# Patient Record
Sex: Male | Born: 1935 | Race: White | Hispanic: No | Smoking: Former smoker
Health system: Southern US, Community
[De-identification: ages and names within clinical notes are randomized; demographics above are authoritative.]

## PROBLEM LIST (undated history)

## (undated) DIAGNOSIS — I2510 Atherosclerotic heart disease of native coronary artery without angina pectoris: Secondary | ICD-10-CM

## (undated) DIAGNOSIS — E785 Hyperlipidemia, unspecified: Secondary | ICD-10-CM

## (undated) DIAGNOSIS — I639 Cerebral infarction, unspecified: Secondary | ICD-10-CM

## (undated) DIAGNOSIS — I219 Acute myocardial infarction, unspecified: Secondary | ICD-10-CM

## (undated) DIAGNOSIS — I6529 Occlusion and stenosis of unspecified carotid artery: Secondary | ICD-10-CM

## (undated) DIAGNOSIS — E7800 Pure hypercholesterolemia, unspecified: Secondary | ICD-10-CM

## (undated) DIAGNOSIS — I10 Essential (primary) hypertension: Secondary | ICD-10-CM

## (undated) HISTORY — DX: Essential (primary) hypertension: I10

## (undated) HISTORY — DX: Cerebral infarction, unspecified: I63.9

## (undated) HISTORY — DX: Atherosclerotic heart disease of native coronary artery without angina pectoris: I25.10

## (undated) HISTORY — DX: Hyperlipidemia, unspecified: E78.5

## (undated) HISTORY — PX: CARDIAC SURGERY: SHX584

## (undated) HISTORY — DX: Acute myocardial infarction, unspecified: I21.9

## (undated) HISTORY — DX: Occlusion and stenosis of unspecified carotid artery: I65.29

---

## 1991-06-30 DIAGNOSIS — I219 Acute myocardial infarction, unspecified: Secondary | ICD-10-CM

## 1991-06-30 HISTORY — DX: Acute myocardial infarction, unspecified: I21.9

## 2007-08-03 ENCOUNTER — Inpatient Hospital Stay (HOSPITAL_COMMUNITY): Admission: RE | Admit: 2007-08-03 | Discharge: 2007-08-07 | Payer: Self-pay | Admitting: Orthopedic Surgery

## 2007-08-10 ENCOUNTER — Ambulatory Visit: Payer: Self-pay | Admitting: *Deleted

## 2007-08-28 ENCOUNTER — Ambulatory Visit: Payer: Self-pay | Admitting: Surgery

## 2007-08-28 ENCOUNTER — Inpatient Hospital Stay (HOSPITAL_COMMUNITY): Admission: EM | Admit: 2007-08-28 | Discharge: 2007-08-31 | Payer: Self-pay | Admitting: Emergency Medicine

## 2007-08-29 ENCOUNTER — Encounter: Payer: Self-pay | Admitting: Endocrinology

## 2007-08-29 ENCOUNTER — Encounter: Payer: Self-pay | Admitting: Gastroenterology

## 2007-08-30 ENCOUNTER — Ambulatory Visit: Payer: Self-pay | Admitting: Gastroenterology

## 2007-09-01 ENCOUNTER — Encounter: Payer: Self-pay | Admitting: Pulmonary Disease

## 2008-02-21 ENCOUNTER — Inpatient Hospital Stay (HOSPITAL_COMMUNITY): Admission: RE | Admit: 2008-02-21 | Discharge: 2008-02-25 | Payer: Self-pay | Admitting: Orthopedic Surgery

## 2008-03-26 ENCOUNTER — Encounter: Admission: RE | Admit: 2008-03-26 | Discharge: 2008-05-06 | Payer: Self-pay | Admitting: Orthopedic Surgery

## 2009-05-08 ENCOUNTER — Encounter: Admission: RE | Admit: 2009-05-08 | Discharge: 2009-05-08 | Payer: Self-pay | Admitting: Internal Medicine

## 2009-06-20 ENCOUNTER — Encounter: Admission: RE | Admit: 2009-06-20 | Discharge: 2009-06-20 | Payer: Self-pay | Admitting: Neurology

## 2009-06-25 ENCOUNTER — Encounter: Admission: RE | Admit: 2009-06-25 | Discharge: 2009-08-14 | Payer: Self-pay | Admitting: Neurology

## 2009-06-26 ENCOUNTER — Encounter: Admission: RE | Admit: 2009-06-26 | Discharge: 2009-06-26 | Payer: Self-pay | Admitting: Neurology

## 2009-09-15 ENCOUNTER — Encounter: Admission: RE | Admit: 2009-09-15 | Discharge: 2009-09-15 | Payer: Self-pay | Admitting: Internal Medicine

## 2010-03-18 ENCOUNTER — Encounter: Admission: RE | Admit: 2010-03-18 | Discharge: 2010-03-18 | Payer: Self-pay | Admitting: Internal Medicine

## 2010-05-25 ENCOUNTER — Ambulatory Visit: Payer: Self-pay | Admitting: Surgery

## 2010-11-09 ENCOUNTER — Ambulatory Visit: Payer: Self-pay | Admitting: Surgery

## 2010-12-21 ENCOUNTER — Encounter: Payer: Self-pay | Admitting: Internal Medicine

## 2011-04-13 NOTE — H&P (Signed)
NAMELITTLETON, HAUB NO.:  [id number]   MEDICAL RECORD NO.:  [id number]          PATIENT TYPE:  INP   LOCATION:  NA                           FACILITY:  [facility]   PHYSICIAN:  Ollen Gross, M.D.    DATE OF BIRTH:  Feb 09, 1927   DATE OF ADMISSION:  08/03/2007  DATE OF DISCHARGE:                              HISTORY & PHYSICAL   DATE OF OFFICE VISIT HISTORY AND PHYSICAL:  July 06, 2007   CHIEF COMPLAINT:  Right knee pain.   HISTORY OF PRESENT ILLNESS:  The patient is 77 year old male with  ongoing problems with his right knee; it has progressively gotten worse  over time.  He has been treated by Dr. Luiz Blare and Dr. Jerl Santos in the  past.  He has undergone serial aspirations and injections.  He was told  at some point in the past that he would need a knee replacement.  He was  seen by Dr. Lequita Halt in a second opinion and found to have bone-on-bone  in the lateral compartment with bone-on-bone patellofemoral compartment,  worse on the right than the left.  The right knee has been more  symptomatic at this time.  It is felt that he would benefit from  undergoing surgical intervention.  The risks and benefits have been  discussed and he elects to proceed with surgery.  He has been seen by  Dr. Aleen Campi preoperatively and felt okay from a cardiac standpoint.  He  has had a recent stress test done in July 2008 and felt to be a low-risk  scan   ALLERGIES:  NO KNOWN DRUG ALLERGIES.   CURRENT MEDICATIONS:  Crestor, Diovan, aspirin, multivitamin.   PAST MEDICAL HISTORY:  1. Hypertension.  2. Hypercholesterolemia.  3. Coronary arterial disease.  4. History of myocardial infarction.  5. Carotid arterial disease.  6. Atrial flutter.  7. History of renal calculi.   PAST SURGICAL HISTORY:  1. Cardiac catheterization with angioplasty.  2. Gallbladder surgery.   SOCIAL HISTORY:  Single, retired, nonsmoker, no alcohol, 1 child, lives  alone, no help after surgery.   FAMILY HISTORY:  Father with history of liver cancer.   REVIEW OF SYSTEMS:  GENERAL:  No fevers, chills or night sweats.  No  seizures, syncope or paralysis.  RESPIRATORY:  No shortness of breath,  productive cough or hemoptysis.  CARDIOVASCULAR:  Extensive cardiac  history with history of MI and heart disease.  No chest pain, angina or  orthopnea.  GI:  No nausea, vomiting, diarrhea, or constipation.  GU:  No dysuria, hematuria or discharge.  MUSCULOSKELETAL:  Right knee.   PHYSICAL EXAMINATION:  VITAL SIGNS:  Pulse 74, respirations 14, blood  pressure 128/78.  GENERAL:  This is a 77 year old white male of short stature, in no acute  distress.  He is alert, oriented and cooperative.  HEENT:  Normocephalic, atraumatic.  Pupils are round and reactive.  Oropharynx  clear.  EOMs intact.  He does have full upper and lower dentures in  place.  NECK:  Trace right bruit, negative on the left.  Neck is supple.  CHEST:  Clear, anterior and posterior chest walls.  No rhonchi, rales or  wheezing.  HEART:  Regular rate and rhythm with a grade 2/6 to 3/6 mid systolic  aortic murmur.  ABDOMEN:  Soft, nontender.  Bowel sounds present.  RECTAL, BREASTS AND GENITALIA;  Not done and not pertinent to present  illness.  EXTREMITIES:  Right knee shows range of motion of 5-120, tender more  medial than lateral.  Marked crepitus is noted.   IMPRESSION:  1. Osteoarthritis, right knee.  2. Hypertension.  3. Coronary arterial disease.  4. History of myocardial infarction.  5. Status post cardiac catheterization with angioplasty.  6. Carotid arterial disease.  7. History of atrial flutter.  8. Hypercholesterolemia.  9. History of renal calculi.   PLAN:  The patient will be admitted to [facility] to undergo a  right total knee replacement arthroplasty.  Surgery will be performed by  Dr. Ollen Gross.  His cardiologist is Dr. Aleen Campi, who has seen and  evaluated the patient preoperatively and  felt to be stable from a  cardiovascular standpoint.  Dr. Aleen Campi will be notified of the room  number on admission and will be consulted if needed for medical  assistance with the patient throughout the hospital course.      [name], P.A.C.      Ollen Gross, M.D.  Electronically Signed    ALP/MEDQ  D:  08/02/2007  T:  08/03/2007  Job:  [id number]   cc:   Antionette Char, MD  Fax: [phone]

## 2011-04-13 NOTE — Assessment & Plan Note (Signed)
OFFICE VISIT   Glenn Meza, Glenn Meza  DOB:  Mar 31, 1927                                       05/25/2010  ZOXWR#:[id number]   REASON FOR VISIT:  Carotid stenosis.   HISTORY:  This is an 77 year old gentleman I am seeing at the request of  Dr. Selena Batten for evaluation of carotid stenosis.  The patient has been  formally followed by Dr. Aleen Campi for progressive carotid stenosis.  He  has a history of a cerebellar stroke.  He has seen Dr. Pearlean Brownie in the past  for this.  He is being treated medically.  He comes in today with an  ultrasound which shows moderate stenosis in the 50%-70% range with  bilateral antegrade vertebral artery blood flow.  The patient states  that after his second knee replacement approximately a year and a half  ago, he has been having some trouble with his balance.  This was  confirmed with MRI that he had had a stroke.  He does not endorse  symptoms of cerebral ischemia at this time.  He denies numbness or  weakness in either arm or his legs.  He denies numbness or slurred  speech.  He denies amaurosis fugax.   The patient had suffered from a heart attack dating back to 36.  He  underwent coronary angioplasty at that time.  He is also medically  managed for his hypertension and hypercholesterolemia.   REVIEW OF SYSTEMS:  GENERAL:  Negative for fevers, chills weight gain  weight loss.  CARDIAC:  Negative for chest pain and shortness of breath.  PULMONARY:  Negative for bronchitis, asthma or wheezing.  GI:  Negative.  GU:  Negative.  VASCULAR:  Positive for a history of stroke.  NEUROLOGIC:  Positive for unsteadiness.  MUSCULOSKELETAL:  Negative.  PSYCH:  Negative.  EENT:  Negative.  HEME:  Negative.  SKIN:  Negative.   PAST MEDICAL HISTORY:  Hypertension, hypercholesterolemia, history MI in  11.   PAST SURGICAL HISTORY:  Bilateral knee replacement, cholecystectomy and  a coronary angioplasty.   FAMILY HISTORY:  Negative for  cardiovascular at an early age.   SOCIAL HISTORY:  He is single with 1 child.  He is retired.  Does not  smoke, quit a few years ago.  Does not drink alcohol.   MEDICATIONS:  Plavix 75 mg per day, Crestor 10 mg per day, Diovan 160 mg  daily, aspirin 81 mg daily, multivitamin, Pepcid.   ALLERGIES:  None.   PHYSICAL EXAMINATION:  Heart rate 69, blood pressure 131/72 in the right  arm, 119/67 in the left.  O2 saturations are 98% on room air.  Generally, he is well-appearing, in no distress.  HEENT:  Within normal  limits.  Lungs are clear bilaterally.  No wheezes or rhonchi.  Cardiovascular:  Regular rate and rhythm without murmur.  No carotid  bruits.  Abdomen is soft, nontender.  No pulsatile mass, no  hepatosplenomegaly.  Musculoskeletal is without major deformity.  Neuro:  He has no focal deficits.  Skin:  Without rash.   ASSESSMENT:  Asymptomatic right carotid stenosis.   PLAN:  I do not think that the patient's balance issues are related to  his extracranial carotid disease.  I would recommend continuing to treat  him medically with a statin and platelet inhibitor.  I would like to see  him back  in 6 months for a repeat ultrasound.  We discussed deferring  operation in the asymptomatic state until he progresses to greater than  80% stenosis.     Jorge Ny, MD  Electronically Signed   VWB/MEDQ  D:  05/25/2010  T:  05/26/2010  Job:  [id number]   cc:   Massie Maroon, MD

## 2011-04-13 NOTE — H&P (Signed)
Glenn Meza, Glenn Meza                ACCOUNT NO.:  [id number]   MEDICAL RECORD NO.:  [id number]            PATIENT TYPE:   LOCATION:                                 FACILITY:   PHYSICIAN:  Hind Bosie Helper, MD      DATE OF BIRTH:  November 18, 1927   DATE OF ADMISSION:  08/28/2007  DATE OF DISCHARGE:                              HISTORY & PHYSICAL   PRIMARY CARE PHYSICIAN:  Antionette Char, M.D.   CHIEF COMPLAINT:  Epigastric pain and lower chest pain.   HISTORY OF PRESENT ILLNESS:  A 77 year old male with a history of  coronary artery disease status post angioplasty.  History of  osteoarthritis status post right total knee replacement which was done  on August 03, 2007, to which patient was sent to rehab.  The patient  has been at rehab since September 8, until 3 days ago when he was  discharged home.  The patient came today with a main complaint of sudden  onset of epigastric/lower chest pain, which started suddenly.  Severity  was 10/10.  Pain is localized, not radiating.  The pain was grabby in  nature associated with nausea and 2 episodes of vomiting which was  mainly food particles.  The patient denies any change in bowel habits.  The last bowel movement was yesterday.  No relieving factor.  No  aggravating factor.  Pain is completely resolved after coming to the ED  and received pain medication.  The patient denies any yellow  discoloration of the sclerae.  Denies any change in urine and bowel  color.  The patient denies any fever at this time.  The patient denies  any headache.  Denies any numbness or weakness of extremities.   PAST MEDICAL HISTORY:  1. History of osteoarthritis status post right knee replacement on      August 03, 2007.  2. Hypertension.  3. Coronary artery disease status post angioplasty.  4. History of atrial fibrillation.  5. History of hypercholesterolemia.  6. Renal calculi.   ALLERGIES:  NO KNOWN DRUG ALLERGIES.   PAST SURGICAL HISTORY:  1. Status  post right knee replacement.  2. Cholecystectomy in 1988.  3. Cardiac cath with angioplasty.   SOCIAL HISTORY:  Single, retired, nonsmoker, nonalcoholic.  He has one  son.  Lives alone, no help.   FAMILY HISTORY:  Father died of liver cancer and mother died with heart  failure.   SYSTEMIC REVIEW:  Denies fever.  Denies night sweating.  Positive for  chills, mainly when he came to the ED.  Denies any syncope or paralysis.  No shortness of breath.  No cough, no hemoptysis.  No chest pain.  Positive for nausea and vomiting.  Denies any diarrhea.  Positive for  abdominal bloating.   PHYSICAL EXAMINATION:  VITAL SIGNS:  Temperature 97.6, blood pressure  91/46, pulse rate 97, respiratory rate 20, saturation 96% on room air.  HEENT:  The patient is normocephalic, atraumatic.  Pupils equal,  reactive to light and accommodation.  NECK:  No JVD.  LUNGS:  Normal vesicular breathing, equal air  entry.  HEART:  S1, S2 with some tachycardia and a grade 3/6 systolic murmur.  ABDOMEN:  Soft, nontender.  Bowel sounds positive.  EXTREMITIES:  No lower limb edema.   LABORATORY DATA:  CMP sodium 136, potassium 4.3, BUN 27, creatinine 1.8,  total bilirubin 1.5, alkaline phosphatase 297, AST 557, ALT 302.  White  blood cells 11.5 and hemoglobin 11.2, hematocrit 33.2.   Ultrasound showed there is evidence of intra and extrahepatic biliary  ductal dilatation.   ASSESSMENT AND PLAN:  Epigastric abdominal pain, most probably secondary  to retained common bile duct stone.  We will admit the patient, keep him  n.p.o. and start him on IV fluids.  We will get MRCP to evaluate the  intra and extrahepatic biliary ductal obstruction.  The patient has no  fever at this time and no abdominal pain.  We will hold off on any  antibiotic at this time.  There is no evidence of acute cholangitis.  Elevated LFTs mainly transaminitis with mild elevated alkaline  phosphatase, with an obstructive biliary disease, suspect  alkaline  phosphatase would be higher.  We will get a hepatitis profile at this  time.  We will consider a gastroenterology consult.  Deep venous  thrombosis and gastrointestinal prophylaxis.      Hind Bosie Helper, MD  Electronically Signed     HIE/MEDQ  D:  08/28/2007  T:  08/28/2007  Job:  [id number]

## 2011-04-13 NOTE — Discharge Summary (Signed)
Glenn Meza, Glenn Meza NO.:  [id number]   MEDICAL RECORD NO.:  [id number]          PATIENT TYPE:  INP   LOCATION:  5501                         FACILITY:  [facility]   PHYSICIAN:  Isidor Holts, M.D.  DATE OF BIRTH:  [birth year]   DATE OF ADMISSION:  08/27/2007  DATE OF DISCHARGE:  08/31/2007                               DISCHARGE SUMMARY   PRIMARY MEDICAL DOCTOR:  Dr. Charolette Child, M.D.   DISCHARGE DIAGNOSES:  1. Choledocholithiasis, status post ERCP, stone extraction, and      sphincterectomy 08/29/2007 by Dr. Claudette Head, gastroenterologist.  2. History of coronary artery disease, status post percutaneous      intervention.  3. Atrial fibrillation.  4. Hypertension.  5. Dyslipidemia.  6. Urolithiasis.  7. Iron-deficiency anemia requiring transfusion of 2 units packed red      blood cells on 08/29/2007.  8. Renal insufficiency.   PROCEDURES:  1. Chest x-ray dated 08/27/2007.  This showed stable changes of COPD      and chronic bronchitis.  No acute abnormality.  2. Abdominal CT scan/pelvic CT scan dated 08/27/2007.  This showed      intra- and extrahepatic biliary ductal dilation with possible      retained stone at the distal common bile duct.  Further evaluation      with ultrasound by MRCP is recommended.  There was extensive      sigmoid diverticulosis, but no acute findings in the pelvis.  3. Abdominal ultrasound scan dated 08/28/2007.  This showed intra- and      extrahepatic biliary ductal dilation.  Distal common bile duct      could not be visualized due to bowel gas.  However, there is      concern for distal common bile ductal stones, small, nonobstructive      renal calculi. Status post cholecystectomy.  4. ERCP 08/29/2007 by Dr. Claudette Head.  This was an uncomplicated      procedure.  Stone extraction and sphincterectomy were done.   CONSULTATIONS:  Dr. Claudette Head, gastroenterologist.   ADMISSION HISTORY:  As in H&P note of 08/28/2007  dictated by Dr. Ebony Cargo.  However, in brief, this is a 77 year old male, with known  history of osteoarthritis, status post right knee replacement 08/03/2007,  hypertension, CAD, status post angioplasty, atrial fibrillation,  dyslipidemia, renal calculi who presents with a sudden onset of  epigastric/lower chest pain on the day of presentation associated with  nausea and 2 episodes of vomiting.  On initial evaluation, patient was  found to have a total bilirubin of 1.5, alkaline phosphatase 297, AST  557, ALT 302.  He was admitted for further evaluation, investigation,  and management.   CLINICAL COURSE:  1. Choledocholithiasis.  For details of presentation, refer to      admission history above.  As noted above, patient's LFTs were      markedly abnormal with alkaline phosphatase of 297, AST 557, ALT      302, i.e. obstructive picture.  He underwent abdominal/pelvic CT      scan.  For details of findings, refer  to procedure list above.      This was followed up with an abdominal ultrasound scan of which      confirmed intra- and extrahepatic biliary ductal dilation with      common bile duct stones.  Gastroenterology consultation was called,      which was kindly provided by Dr. Claudette Head who carried out ERCP      on 08/29/2007 with stone extraction and sphincterectomy in an      uncomplicated procedure.  Following this, patient's clinical      symptoms considerably ameliorated.  By 08/30/2007, he was totally      asymptomatic, and LFTs have improved with the following findings on      08/31/2007.  Alkaline phosphatase 157, AST 52, ALT 163.   1. Iron-deficiency anemia.  Patient was noted to have a significant      anemia of 8.1 on 08/29/2007.  This was addressed with transfusion of      2 units of PRBCs resulting in a posttransfusion hemoglobin of 9.8.      Iron studies were done, which showed serum iron of less than 10,      i.e. features consistent with profound iron-deficiency  anemia.  Per      GI, patient merits GI workup.  He has therefore been scheduled an      appointment with Dr. Judie Petit on an outpatient basis, to arrange      colonoscopy.   1. Coronary artery disease.  This remained asymptomatic throughout the      course of patient's hospitalization.   1. Renal insufficiency.  Patient at this time of presentation had a      BUN of 27 with a creatinine of 1.8.  His renal indices have      remained within these limits, during the course of his      hospitalization.  As a matter of fact, on 08/31/2007, his BUN was 20      with a creatinine of 1.72 likely secondary to intravenous      hydration.  It is likely that these creatinine levels are patient's      baseline.  We shall defer monitoring of this per his primary MD.   1. Hypertension.  This was controlled throughout the course of      patient's hospitalization off antihypertensive medications.  His      ARB, i.e. Diovan, has been held in view of renal dysfunction.      However, given his history of coronary artery disease, he has been      commenced on low-dose Lopressor 12.5 mg p.o. b.i.d. in the first      instance until reevaluated by his primary cardiologist, Dr.      Aleen Campi.   DISPOSITION:  The patient was considered sufficiently stable to be  discharged on 08/31/2007.   DISCHARGE MEDICATIONS:  1. Crestor 20 mg p.o. q.h.s.  2. Aspirin 325 mg p.o. daily.  3. Lopressor 12.5 mg p.o. b.i.d.   Note Diovan 160 mg has been discontinued secondary to renal  insufficiency, until reevaluated by patient's primary cardiologist, Dr.  Aleen Campi.   DIET:  Heart healthy.   ACTIVITY:  As tolerated.  Patient has home PT/OT.   FOLLOWUP INSTRUCTIONS:  Patient is instructed to follow up with his  primary MD, Dr. Aleen Campi, per prior scheduled appointment.  He is also  to follow up with gastroenterologist, Dr. Claudette Head, on October 02, 2007 at 9:15 a.m., telephone number [phone].  All this has  been  communicated to patient, and he has verbalized understanding.      Isidor Holts, M.D.  Electronically Signed     CO/MEDQ  D:  08/31/2007  T:  08/31/2007  Job:  [id number]   cc:   Antionette Char, MD  Venita Lick. Russella Dar, MD, Clementeen Graham

## 2011-04-13 NOTE — Assessment & Plan Note (Signed)
OFFICE VISIT   TALLIN, HART  DOB:  [birth year]                                       11/09/2010  ONGEX#:[id number]   The patient comes back in today for followup of his extracranial carotid  disease.  He is an 77 year old gentleman that I met in June of this past  year for evaluation of carotid stenosis which had previously been  followed by Dr. Aleen Campi.  He has a history of a cerebellar stroke that  is being treated medically.  At that time he had an ultrasound which  showed 50%-70% stenosis with bilateral antegrade vertebral artery blood  flow.  He has been having some trouble with balance, however, he reports  this has not been changed and thought to be related to his cerebellar  stroke.  He did undergo physical therapy for this but it has not helped.  He denies numbness or weakness in either arm.  He denies slurred speech.  He denies amaurosis fugax.   PHYSICAL EXAMINATION:  Vital signs:  Heart rate 71, blood pressure  144/77 on the right, 120/71 on the left, O2 sats are 96% on room air.  General:  Well-appearing, in no distress.  HEENT:  Within normal limits.  Lungs:  Clear bilaterally.  Cardiovascular:  Regular rate and rhythm.  No carotid bruits.  Neurologic:  He is intact.  Skin:  Without rash.   DIAGNOSTIC STUDIES:  Carotid ultrasound was performed today and this  shows 40%-59% stenosis bilaterally.   ASSESSMENT:  Asymptomatic carotid stenosis.   PLAN:  Based on the patient's current level of stenosis I would  recommend continued medical therapy with blood pressure control,  platelet inhibition and cholesterol modification.  I will plan on seeing  the patient back in 1 year with a repeat ultrasound.     Jorge Ny, MD  Electronically Signed   VWB/MEDQ  D:  11/09/2010  T:  11/09/2010  Job:  [id number]   cc:   Dr Pearlean Brownie  Massie Maroon, MD

## 2011-04-13 NOTE — Op Note (Signed)
NAMEHARUO, Meza NO.:  [id number]   MEDICAL RECORD NO.:  [id number]          PATIENT TYPE:  INP   LOCATION:  1607                         FACILITY:  [facility]   PHYSICIAN:  Ollen Gross, M.D.    DATE OF BIRTH:  04-Mar-1927   DATE OF PROCEDURE:  02/21/2008  DATE OF DISCHARGE:                               OPERATIVE REPORT   PREOPERATIVE DIAGNOSIS:  Osteoarthritis left knee.   POSTOPERATIVE DIAGNOSIS:  Osteoarthritis left knee.   PROCEDURE:  Left total knee arthroplasty.   SURGEON:  Ollen Gross, M.D.   ASSISTANT:  Avel Peace PA-C.   ANESTHESIA:  General with postop Marcaine pain pump.   ESTIMATED BLOOD LOSS:  Minimal.   DRAINS:  None.   TOURNIQUET TIME:  30 minutes at 300 mmHg.   COMPLICATIONS:  None.   CONDITION:  Stable to recovery.   CLINICAL NOTE:  Glenn Meza is a 77 year old male with end-stage  arthritis of the left knee with progressively worsening pain and  dysfunction.  He has had a recent successful right total knee  arthroplasty and presents now for left total knee arthroplasty.   PROCEDURE IN DETAIL:  After the successful administration of general  anesthetic, a tourniquet was placed on the left thigh, left lower  extremity prepped and draped in usual sterile fashion.  Extremity was  wrapped in Esmarch, knee flexed, tourniquet inflated to 300 mmHg.  Midline incision made with a 10 blade through subcutaneous tissue to the  level of the extensor mechanism.  Fresh blade is used to make and a  medial parapatellar arthrotomy.  Soft tissue of the proximal medial  tibia is subperiosteally elevated to the joint line with the knife,  semimembranosus bursa with a Cobb elevator.  Soft tissue laterally is  elevated with attention being paid to avoid the patellar tendon on  tibial tubercle.  Patella subluxed laterally, knee flexed 90 degrees and  ACL and PCL removed.  Drill was used create a starting hole in the  distal femur and the canal was  thoroughly irrigated.  The 5 degree left  valgus alignment guide is placed and referencing off the posterior  condyles, rotations marked and a block pinned to remove 10 mm off the  distal femur.  Distal femoral resection is made with an oscillating saw.  Sizing blocks placed and size 4 is most appropriate.  Rotations marked  at the epicondylar axis.  Size 4 cutting blocks placed and the anterior,  posterior and chamfer cuts made.   The tibia is subluxed forward menisci removed.  Extramedullary tibial  alignment guide is placed referencing proximally at the medial aspect of  the tibial tubercle and distally along the second metatarsal axis and  tibial crest.  Block is pinned to remove 4 mm off the more deficient  lateral side.  Tibial resection is made with an oscillating saw.  Sizing  blocks placed, size 4 is most appropriate.  The proximal tibia is  prepared with the modular drill and keel punch for a size 4.  Femoral  preparation is completed with the intercondylar cut.   Size  4 mobile bearing tibial trial, size 4 posterior stabilized femoral  trial and a 10 mm posterior stabilized rotating platform insert trial  are placed.  With the 10, there is a little bit of varus-valgus in  place, went to 12.5 which allowed for full extension with excellent  varus valgus, anterior and posterior balance throughout full range of  motion.  Patella was then everted, it was measured to be 23 mm.  Freehand resection was taken to 14 mm, 38 template is placed, lug holes  are drilled, trial patella is placed and tracks normally.  Osteophytes  removed off the posterior femur with the trial in place.  All trials are  removed and the cut bone surfaces are prepared with pulsatile lavage.  Cement is mixed and once ready for implantation, the size 4 mobile  bearing tibial tray, size 4 posterior stabilized femur and 38 patella  are cemented into place and patella is held with the clamp.  Trial 12.5  insert is  placed, knee held in full extension and all extruded cement  removed.  Once the cement is fully hardened, then the permanent 12.5 mm  posterior stabilized rotating platform insert is placed into the tibial  tray.  Wounds copiously irrigated with saline solution.  The FloSeal is  then injected at the posterior capsule and then on the medial and  lateral gutters and suprapatellar area.  Moist sponge is placed and  tourniquet released for total time of 30 minutes.  Sponge is held for  two minutes, then removed.  Minimal bleeding is encountered.  That which  is encountered is stopped with electrocautery.  The wound is again  irrigated and arthrotomy closed with interrupted #1 PDS.  Flexion  against gravity has been 140 degrees.  Subcu is closed with interrupted  2-0 Vicryl, subcuticular running 4-0 Monocryl.  The incision is cleaned  and dried and the catheter for the Marcaine pain pump is placed and the  pump is initiated.  Steri-Strips and a bulky sterile dressing are  applied.  He is placed into a knee immobilizer, awakened and transported  to recovery in stable condition.      Ollen Gross, M.D.  Electronically Signed     FA/MEDQ  D:  02/21/2008  T:  02/21/2008  Job:  [id number]

## 2011-04-13 NOTE — Procedures (Signed)
DUPLEX DEEP VENOUS EXAM - LOWER EXTREMITY   INDICATION:  Right leg pain and swelling   HISTORY:  Edema:  Right leg  Trauma/Surgery:  Right knee arthroplasty in August of 2008  Pain:  Right leg  PE:  No  Previous DVT:  No  Anticoagulants:  The patient is on Coumadin prophylactically following  knee replacement  Other:  No   DUPLEX EXAM:                CFV   SFV   PopV  PTV    GSV                R  L  R  L  R  L  R   L  R  L  Thrombosis    0  0  0     0     0      0  Spontaneous   +  +  +     +     +      +  Phasic        +  +  +     +     +      +  Augmentation  +  +  +     +     +      +  Compressible  +  +  +     +     +      +  Competent     +  +  +     +     +      +   Legend:  + - yes  o - no  p - partial  D - decreased   IMPRESSION:  No evidence of right leg DVT, SVT or Baker cyst.   _____________________________  Quita Skye Hart [place], M.D.   MC/MEDQ  D:  08/10/2007  T:  08/11/2007  Job:  [id number]

## 2011-04-13 NOTE — Op Note (Signed)
NAMESAVANNAH, ERBE NO.:  [id number]   MEDICAL RECORD NO.:  [id number]          PATIENT TYPE:  INP   LOCATION:  X005                         FACILITY:  [facility]   PHYSICIAN:  Ollen Gross, M.D.    DATE OF BIRTH:  1927-11-12   DATE OF PROCEDURE:  08/03/2007  DATE OF DISCHARGE:                               OPERATIVE REPORT   PREOPERATIVE DIAGNOSIS:  Osteoarthritis right knee.   POSTOPERATIVE DIAGNOSIS:  Osteoarthritis right knee.   PROCEDURE:  Right total knee arthroplasty.   SURGEON:  Dr. Lequita Halt.   ASSISTANT:  Avel Peace, PA-C.   ANESTHESIA:  Spinal.   ESTIMATED BLOOD LOSS:  Minimal.   DRAINS:  None.   COMPLICATIONS:  None.   TOURNIQUET TIME:  34 minutes at 300 mmHg.   CONDITION.:  Stable to recovery.   CLINICAL NOTE:  Mr. Mcelhannon is a52 year old male who has severe arthritis  both knees, right more symptomatic than the left.  He has failed  nonoperative management and presents for a total knee arthroplasty.   PROCEDURE IN DETAIL:  After the successful administration of spinal  anesthetic, a tourniquet was placed high on the right thigh and his  right lower extremity was prepped and draped in the usual sterile  fashion.  Extremities wrapped in Esmarch, knee flexed, tourniquet  inflated to 300 mmHg. A midline incision was made with a 10 blade  through the subcutaneous tissue to the level of the extensor mechanism.  A fresh blade is used to make a medial parapatellar arthrotomy. The soft  tissue over the proximal medial tibia was subperiosteally elevated to  the joint line with a knife and into the semimembranosus bursa with a  Cobb elevator. The soft tissue laterally is elevated with attention  being paid to avoiding the patellar tendon on the tibial tubercle.  The  patella is subluxed laterally, knee flexed 90 degrees, ACL and PCL  removed. A drill was used create a starting hole in the distal femur and  canal was thoroughly irrigated.  A 5  degree right valgus alignment guide  is placed and referencing off the posterior condyles rotation is marked  and a block pinned to remove 10 mm off the distal femur.  Distal femoral  resection is made with an oscillating saw.  A size 4 is the most  appropriate and then the size 4 cutting block is placed with the  rotation marked off the epicondylar axis.  The anterior, posterior and  chamfer cuts are then made.   The tibia is subluxed forward and menisci are removed.  Extramedullary  tibial alignment guide is placed referencing proximally off the medial  aspect of the tibial tubercle and distally along the second metatarsal  axis and tibial crest. The block is pinned to remove about 4 mm off the  more deficient lateral side.  Tibial resection is made with an  oscillating saw.  Size 4 was also the most appropriate tibial component  and the proximal tibia was prepared with a modular drill and keel punch  for a size 4.  Femoral preparation is completed with  the intercondylar  cut.   A size 4 mobile bearing tibial trial and a size 4 posterior stabilized  femoral trial and a 12.5-mm posterior stabilized rotating platform  insert trial are placed.  The 12.5 is so tiny a bit of laxity in flexion  and extension so I went to a 15.  With this full extension was achieved  with excellent varus and valgus balance throughout full range of motion.  The patella is then everted and thickness measured to be 24 mm.  Freehand resection is taken to 14 mm, 38 template is placed, lug holes  are drilled, trial patella was placed and it tracks normally.  Osteophytes are removed off the posterior femur with the trial in place.  All trials are removed and the cut bone surfaces are prepared with  pulsatile lavage. The cement is mixed and once ready for implantation,  the size 4 mobile bearing tibial tray, size 4 posterior stabilized femur  and 30 patella are cemented into place.  The patella was held with a   clamp.  A trial 15 insert is placed, knee held in full extension and all  extruded cement removed.  Once the cement is fully hardened then the  wound is copiously irrigated and the trial removed.  The FloSeal was  placed into the back of the joint and then the permanent 15 mm posterior  stabilized rotating platform insert is placed into the tibial tray.  FloSeal was then placed in the medial and lateral gutters and  suprapatellar area. A moist sponge is placed and pressure held for about  2 minutes.  When the sponge was removed, there was minimal if any  bleeding.  The wound was irrigated and the arthrotomy closed with  interrupted #1 PDS.  Flexion against gravity was 125 degrees.  The subcu  was closed with interrupted 2-0 Vicryl and subcuticular running 4-0  Monocryl.  The incision was cleaned and dried and the Steri-Strips and  sterile dressing applied.  He is then awakened and transferred to  recovery in stable condition.      Ollen Gross, M.D.  Electronically Signed     FA/MEDQ  D:  08/03/2007  T:  08/03/2007  Job:  [id number]

## 2011-04-13 NOTE — H&P (Signed)
Glenn, Meza NO.:  [id number]   MEDICAL RECORD NO.:  [id number]          PATIENT TYPE:  INP   LOCATION:  NA                           FACILITY:  [facility]   PHYSICIAN:  Ollen Gross, M.D.    DATE OF BIRTH:  June 12, 1927   DATE OF ADMISSION:  01/12/2008  DATE OF DISCHARGE:  01/16/2008                              HISTORY & PHYSICAL   DATE OF OFFICE VISIT:  February 08, 2008   DATE OF ADMISSION:  February 21, 2008   CHIEF COMPLAINT:  Left knee pain.   HISTORY OF PRESENT ILLNESS:  The patient is a 77 year old male who has  been seen by Dr. Lequita Halt. Well known, having previously undergone a  right total knee back in September 2008; doing well with that. He has  continued left knee pain and has reached a point where he would like to  have that side done.  Risks and benefits discussed.  The patient elected  to proceed with total knee replacement.   ALLERGIES:  NO KNOWN DRUG ALLERGIES.   CURRENT MEDICATIONS:  1. Crestor.  2. Metoprolol.  3. Aspirin.  4. Multivitamin.   PAST MEDICAL HISTORY:  1. Hypertension.  2. Coronary arterial disease.  3. History of myocardial infarction.  4. Hypercholesterolemia.  5. History of cholelithiasis.  6. Renal insufficiency.  7. Renal calculi.  8. Carotid arterial disease.  9. History of atrial flutter.  10.History of anemia.  11.Arthritis.  12.History of cardiac catheterization with angioplasty.   PAST SURGICAL HISTORY:  1. Cardiac catheterization with angioplasty.  2. Gallbladder surgery.  3. Recent right total knee.   SOCIAL HISTORY:  Single, retired, nonsmoker.  No alcohol.  He has lined  up someone to help him after surgery.   FAMILY HISTORY:  Father with liver cancer.   REVIEW OF SYSTEMS:  GENERAL:  No fevers, chills or night sweats.  NEURO:  No seizures or paralysis.  RESPIRATORY: No shortness of breath,  productive cough or hemoptysis.  CARDIOVASCULAR:  No chest pain, or  orthopnea. He does have a history of  MI and heart disease. GI: No  nausea, vomiting, diarrhea or constipation.  GU: No dysuria, hematuria  or discharge.  Musculoskeletal: Left knee.   PHYSICAL EXAMINATION:  VITAL SIGNS: Pulse 68, respirations 12, blood  pressure 144/76.  GENERAL: 77 year old white male, well-nourished, well-developed, short  stature in no acute distress, alert, oriented and cooperative.  HEENT: Normocephalic, atraumatic.  Pupils are reactive.  Oropharynx  clear.  EOMs intact.  NECK:  Supple.  Right carotid bruit noted.  CHEST:  Clear anterior, posterior chest walls.  No rhonchi, rales or  wheezing.  HEART: Regular rate and rhythm with a grade 2-3/6 systolic ejection  murmur best heard over aortic point.  ABDOMEN:  Soft, nontender.  Bowel  sounds present.  RECTAL, BREASTS and GENITALIA: Not done.  EXTREMITIES:  Left knee: motor function intact.  Crepitus noted.  No  instability.   IMPRESSION:  1. Osteoarthritis left knee.  2. Hypertension.  3. Coronary arterial disease.  4. History of myocardial infarction.  5. Carotid arterial disease.  6. Atrial flutter.  7. Hypercholesterolemia.  8. History of cholelithiasis.  9. Renal insufficiency.  10.Renal calculi.  11.History of anemia.   PLAN:  The patient admitted to [facility] to go undergo a left  total knee replacement arthroplasty.  Surgery will be performed by Ollen Gross. His cardiologist Dr. Aleen Campi will be notified of the room on  admission and will be consulted if needed for medical assistance with  the patient throughout the hospital course.      Glenn Meza, P.A.C.      Ollen Gross, M.D.  Electronically Signed    ALP/MEDQ  D:  02/15/2008  T:  02/15/2008  Job:  [id number]   cc:   Antionette Char, MD  Fax: [phone]   Ollen Gross, M.D.  Fax: [phone]

## 2011-04-13 NOTE — Discharge Summary (Signed)
Glenn Meza, Glenn Meza NO.:  [id number]   MEDICAL RECORD NO.:  [id number]          PATIENT TYPE:  INP   LOCATION:  1615                         FACILITY:  [facility]   PHYSICIAN:  Ollen Gross, M.D.    DATE OF BIRTH:  [birth year]   DATE OF ADMISSION:  08/03/2007  DATE OF DISCHARGE:  08/07/2007                               DISCHARGE SUMMARY   ADMISSION DIAGNOSES:  1. Osteoarthritis, right knee.  2. Hypertension.  3. Coronary arterial disease.  4. History of myocardial infarction.  5. Status post cardiac catheterization with angioplasty.  6. Carotid arterial disease.  7. History of atrial flutter.  8. Hypercholesterolemia.  9. Renal calculi.   DISCHARGE DIAGNOSES:  1. Osteoarthritis, right knee status post right total knee replacement      arthroplasty.  2. Mild post-operative blood loss anemia.  Did not require      transfusion.  3. Coronary arterial disease.  4. History of myocardial infarction.  5. Status post cardiac catheterization with angioplasty.  6. Carotid arterial disease.  7. History of atrial flutter.  8. Hypercholesterolemia.  9. Renal calculi.   PROCEDURE:  Right total knee on August 03, 2007.   SURGEON:  Dr. Lequita Halt.   ASSISTANT:  Avel Peace, P.A.C.   ANESTHESIA:  Spinal.   CONSULTS:  None.   BRIEF HISTORY:  Glenn Meza is a 77 year old male with severe  degenerative arthritis of both knees, right more symptomatic than left,  with failure to improve, who now presents for total knee arthroplasty.   LABORATORY DATA:  Pre-op CBC showed hemoglobin 12.5, hematocrit of 36.7,  white cell count 7.2, post-op hemoglobin 9.1, came back up to 9.6, last  H&H was 8.9 and 25.8.  PT/PTT on admission was 32.9 and 35 respectively,  INR 1.1.  Serial INRs were followed and were __________ .9 and 1.7.  Chem panel on admission all within normal limits.  Serial BMETs were  followed, electrolytes remained within normal limits.  Pre-op UA  negative, Blood  group and type A positive.   Chest x-rays July 27, 2007, lack of disease.   HOSPITAL COURSE:  The patient was admitted to [facility],  tolerated the procedure well, later to recovery room on orthopedic  floor.  Started on PCA and p.o. analgesics for  pain control following  surgery and given 24 hours of post-op antibiotics.  Actually did pretty  well, according to day one.  The pain was under good control.  He is  going to start back on his home medications.  He had a history of atrial  flutter, but it was rate controlled.  Hemoglobin is down to 9.1.  He was  monitored for symptoms.  Started getting up out of bed with PT.  By day  2, he was doing pretty well.  He was back up on a bed.  We got discharge  planning involved to arrange a skilled nursing facility.  He had no help  at home.  Started actually walking about 30 to 60 feet by post-op day 2,  post-op day 3.  Dressing was changed on  day 2.  Incision looked good,  healing well.  He slowly progressed through the weekend, had a little  bit of low-grade temperature 100.8, but with antipyretics and incentive  spirometer, his temperature came back down.  On post-op day 4 on Monday  morning, he was seen back in rounds.  He was doing well, is afebrile,  progressing with his therapy.  Incision was healing well.  Discharge  planning located a bed at [facility] nursing facility.  The patient was  in agreement  at that time.   DISCHARGE PLAN:  The patient was transferred over to [facility]  facility.   DISCHARGE DIAGNOSES:  Please see above.   DISCHARGE MEDICATIONS:  1. Diovan 160 mg p.o. daily.  2. Crestor 10 mg daily.  3. Coumadin protocol.  Please titrate the Coumadin level, target INR      between 2.0 and 3.0, needs to be on Coumadin for 3 weeks.  4. Colace 100 mg  p.o. b.i.d.  5. Nu Iron 150 mEq caplets p.o. b.i.d.  6. Laxative of choice, Enema of choice.  7. Percocet 5 mg 1 or 2 every 4 to 6 hours as needed for  pain.  8. Tylenol 325 mg, one or two every 4 to 6 hours as needed for mild      pain, temporal headache.  9. Robaxin 500 mg p.o. q. 6 h. p.r.n. spasm.   DIET:  Low cholesterol heart healthy diet.   ACTIVITY:  He is weightbearing as tolerated to the right lower  extremity.  Gait training, ambulation, ADLs, as per PT and OT.  Total  knee protocol.  Needs to be up out of bed minimum b.i.d., weightbearing  as tolerated.  May start showering; however, do not submerge the  incision under water, daily dressing changes after showering.  He is to  follow up with Dr. Lequita Halt in the office, two weeks from the date of  surgery.  Please contact the office at [phone] to arrange appointment  and followup for this patient.   DISPOSITION:  [facility] nursing facility.   CONDITION ON DISCHARGE:  Improved.      [name], P.A.C.      Ollen Gross, M.D.  Electronically Signed    ALP/MEDQ  D:  08/07/2007  T:  08/07/2007  Job:  [id number]   cc:   Ollen Gross, M.D.  Fax: [phone]   Antionette Char, MD  Fax: [phone]

## 2011-04-13 NOTE — Discharge Summary (Signed)
[name], [name] NO.:  [id number]   MEDICAL RECORD NO.:  [id number]          PATIENT TYPE:  INP   LOCATION:  1607                         FACILITY:  [facility]   PHYSICIAN:  Ollen Gross, M.D.    DATE OF BIRTH:  01/29/1927   DATE OF ADMISSION:  02/21/2008  DATE OF DISCHARGE:  02/25/2008                               DISCHARGE SUMMARY   ADMITTING DIAGNOSES:  1. Osteoarthritis, left knee.  2. Hypertension.  3. Coronary arterial disease.  4. History of myocardial infarction.  5. Carotid arterial disease.  6. Atrial flutter.  7. Hypercholesterolemia.  8. History of cholelithiasis.  9. Renal insufficiency.  10.Renal calculi.  11.History of anemia.   DISCHARGE DIAGNOSES:  1. Osteoarthritis left knee status post left total knee replacement      arthroplasty.  2. Postop blood loss anemia.  3. Status post transfusion without sequelae.  4. Postop mild renal insufficiency, improving.  5. Hypertension.  6. Coronary arterial disease.  7. History of myocardial infarction.  8. Carotid arterial disease.  9. Atrial flutter.  10.Hypercholesterolemia.  11.History of cholelithiasis.  12.Renal insufficiency.  13.Renal calculi.  14.History of anemia.   PROCEDURE:  February 21, 2008:  Left total knee.  Surgeon Dr. Lequita Halt.  Assistant Avel Peace, PA-C.  Anesthesia general.   CONSULTS:  None.   BRIEF HISTORY:  [name] is a 81-year male with end-stage arthritis of  the left knee, progressive worsening pain, dysfunction, successful right  total knee, now presents for left total knee.   LABORATORY DATA:  Preop CBC showed a hemoglobin of 12.9, hematocrit  38.5, white cell count 6.3, red cell count 4.2, platelets 294,000.  Postop hemoglobin 9.1, drift down to 8.  Given two units of blood.  Post  transfusion hemoglobin back up to 11.5 with 32.3.  PT/PTT preop 13.8 and  36 respectively.  INR 1.1.  Serial protimes followed.  Estimated PT/INR  23.7 and 2.  Chem panel on  admission showed a slight low sodium of 133.  Remaining Chem panel within normal limits.  Serial  BMETs were followed.  Creatinine did drop from 1.4-1.6, got as high as 1.74, improving back  down to 1.5.  Sodium went up 138, back down to 134, which is a little  bit better than preop.  Preop UA negative.  Blood group type A positive.   Preop stress test:  EKG was negative for ischemia, low-risk scan, no  prior study available for comparison.  Abnormal myocardial perfusion  imaging.  Post stress ejection fraction is 52%, global left ventricular  systolic function is normal.   HOSPITAL COURSE:  The patient admitted to [facility].  Tolerated the procedure well.  Later transferred to recovery room and  orthopedic floor.  Started on PCA and p.o. analgesic pain for pain  control following surgery.  Given 24 hours of postop IV antibiotics.  Started on Coumadin for DVT prophylaxis.  He was on beta blockers due to  his heart.  Reduced the dose a little bit because his pressures were  low.  Gave him fluid boluses because of a  low output.  A slight increase  in his creatinine and also, with the low pressures, felt he was going  into some renal insufficiency.  Also, due to having carotid arterial  disease, wanted to keep his pressure up so we bolused him with fluids.  Hemoglobin was down to 9.1 so put him on iron supplements.  He got up  and walked short distances on day 2.  Walked about 7 feet and then about  34 feet.  He had a little low-grade temp.  Encouraged incentive  spirometer.  Continued with the renal insufficiency but his pressures  did improve with the fluids.  As long as that continued to improve and  he was progressing with therapy, felt he could go home in the next  couple of days.  By day three, creatinine had reached its high point of  1.7 but he had decent output.  Would recheck his creatinine in the  morning.  He was given blood the day before because his hemoglobin was   down to 8 on postop day #2.  By day three, it was back up to 11.5 which  did help.  As long as he stabilized, improved, he could possibly go home  the next day.  He walked about 100 feet on the afternoon postop day #3.  Continued progressing and was ready to go home by postop day #4.  Creatinine had improved down to 1.5.   DISCHARGE/PLAN:  The patient discharged home on February 25, 2008.   DISCHARGE DIAGNOSES:  Same as above.   DISCHARGE MEDS:  Percocet, Robaxin, Coumadin.   ACTIVITY:  Weightbearing as tolerated, total knee protocol.  Home health  PT and home nursing.   DIET:  Low-sodium heart-healthy diet.   FOLLOW-UP:  2 weeks.   DISPOSITION:  Home.   CONDITION ON DISCHARGE:  Improving.      [name], P.A.C.      Ollen Gross, M.D.  Electronically Signed    ALP/MEDQ  D:  03/04/2008  T:  03/04/2008  Job:  [id number]   cc:   Antionette Char, MD  Fax: [phone]

## 2011-04-13 NOTE — Procedures (Signed)
CAROTID DUPLEX EXAM   INDICATION:  Carotid stenosis.   HISTORY:  Diabetes:  No.  Cardiac:  MI in 1992.  Hypertension:  Yes.  Smoking:  No.  Previous Surgery:  No.  CV History:  Asymptomatic.  Amaurosis Fugax No, Paresthesias No, Hemiparesis No                                       RIGHT             LEFT  Brachial systolic pressure:         138               140  Brachial Doppler waveforms:         Normal            Normal  Vertebral direction of flow:        Antegrade         Antegrade  DUPLEX VELOCITIES (cm/sec)  CCA peak systolic                   78                106  ECA peak systolic                   160               104  ICA peak systolic                   191               135  ICA end diastolic                   69                57  PLAQUE MORPHOLOGY:                  Mixed             Mixed  PLAQUE AMOUNT:                      Moderate          Moderate  PLAQUE LOCATION:                    ICA, ECA          ICA, ECA   IMPRESSION:  1. Right internal carotid artery velocity suggestive of a 40%-59%      stenosis.  2. Left internal carotid artery velocity suggestive of a 40%-59%      stenosis.  3. Right external carotid artery stenosis.   ___________________________________________  Quita Skye Hart [place], M.D.   EM/MEDQ  D:  11/10/2010  T:  11/10/2010  Job:  [id number]

## 2011-05-24 ENCOUNTER — Other Ambulatory Visit: Payer: Self-pay

## 2011-05-24 ENCOUNTER — Ambulatory Visit: Payer: Self-pay | Admitting: Surgery

## 2011-08-23 LAB — TYPE AND SCREEN

## 2011-08-23 LAB — BASIC METABOLIC PANEL
BUN: 16
BUN: 19
BUN: 20
CO2: 26
Calcium: 8 — ABNORMAL LOW
Calcium: 8 — ABNORMAL LOW
Calcium: 8.3 — ABNORMAL LOW
Calcium: 8.4
Calcium: 8.7
Chloride: 104
Creatinine, Ser: 1.54 — ABNORMAL HIGH
Creatinine, Ser: 1.58 — ABNORMAL HIGH
Creatinine, Ser: 1.65 — ABNORMAL HIGH
Creatinine, Ser: 1.73 — ABNORMAL HIGH
Creatinine, Ser: 1.74 — ABNORMAL HIGH
GFR calc Af Amer: 46 — ABNORMAL LOW
GFR calc Af Amer: 51 — ABNORMAL LOW
GFR calc non Af Amer: 38 — ABNORMAL LOW
GFR calc non Af Amer: 38 — ABNORMAL LOW
GFR calc non Af Amer: 40 — ABNORMAL LOW
Glucose, Bld: 123 — ABNORMAL HIGH
Glucose, Bld: 141 — ABNORMAL HIGH
Potassium: 4.2
Sodium: 135
Sodium: 135
Sodium: 137

## 2011-08-23 LAB — COMPREHENSIVE METABOLIC PANEL
ALT: 16
AST: 23
Chloride: 99
GFR calc Af Amer: 55 — ABNORMAL LOW
GFR calc non Af Amer: 46 — ABNORMAL LOW
Glucose, Bld: 82
Potassium: 4.1
Sodium: 133 — ABNORMAL LOW
Total Protein: 7.2

## 2011-08-23 LAB — CBC
HCT: 38.5 — ABNORMAL LOW
Hemoglobin: 12.9 — ABNORMAL LOW
Hemoglobin: 8 — ABNORMAL LOW
MCV: 89.1
MCV: 90.1
Platelets: 158
Platelets: 164
Platelets: 185
RDW: 13.8
RDW: 13.9
RDW: 14.2
WBC: 5.7
WBC: 8.8

## 2011-08-23 LAB — URINALYSIS, ROUTINE W REFLEX MICROSCOPIC
Protein, ur: NEGATIVE
Specific Gravity, Urine: 1.011

## 2011-08-23 LAB — PROTIME-INR
INR: 1.9 — ABNORMAL HIGH
INR: 2 — ABNORMAL HIGH
Prothrombin Time: 13.9
Prothrombin Time: 15.7 — ABNORMAL HIGH
Prothrombin Time: 22.7 — ABNORMAL HIGH
Prothrombin Time: 23.5 — ABNORMAL HIGH

## 2011-08-23 LAB — PREPARE RBC (CROSSMATCH)

## 2011-09-09 LAB — CBC
HCT: 28.9 — ABNORMAL LOW
HCT: 23.4 — ABNORMAL LOW
HCT: 33.3 — ABNORMAL LOW
Hemoglobin: 9.8 — ABNORMAL LOW
Hemoglobin: 8.1 — ABNORMAL LOW
MCHC: 33.5
MCHC: 33.9
MCV: 89.7
MCV: 90.9
MCV: 91.7
Platelets: 223
Platelets: 230
Platelets: 432 — ABNORMAL HIGH
RBC: 3.2 — ABNORMAL LOW
RDW: 14.4 — ABNORMAL HIGH
RDW: 14
RDW: 14.4 — ABNORMAL HIGH
WBC: 13.9 — ABNORMAL HIGH
WBC: 20.9 — ABNORMAL HIGH

## 2011-09-09 LAB — ABO/RH: ABO/RH(D): A POS

## 2011-09-09 LAB — URINALYSIS, ROUTINE W REFLEX MICROSCOPIC
Glucose, UA: NEGATIVE
Hgb urine dipstick: NEGATIVE
Protein, ur: 30 — AB
Specific Gravity, Urine: 1.019

## 2011-09-09 LAB — COMPREHENSIVE METABOLIC PANEL
ALT: 221 — ABNORMAL HIGH
AST: 88 — ABNORMAL HIGH
AST: 557 — ABNORMAL HIGH
Albumin: 2.4 — ABNORMAL LOW
Albumin: 2.4 — ABNORMAL LOW
Albumin: 3.9
Alkaline Phosphatase: 157 — ABNORMAL HIGH
Alkaline Phosphatase: 171 — ABNORMAL HIGH
BUN: 20
BUN: 27 — ABNORMAL HIGH
BUN: 31 — ABNORMAL HIGH
CO2: 19
Calcium: 7.9 — ABNORMAL LOW
Calcium: 7.9 — ABNORMAL LOW
Chloride: 106
Creatinine, Ser: 1.72 — ABNORMAL HIGH
Creatinine, Ser: 1.81 — ABNORMAL HIGH
Creatinine, Ser: 1.83 — ABNORMAL HIGH
GFR calc Af Amer: 44 — ABNORMAL LOW
GFR calc Af Amer: 44 — ABNORMAL LOW
GFR calc non Af Amer: 36 — ABNORMAL LOW
GFR calc non Af Amer: 32 — ABNORMAL LOW
Glucose, Bld: 95
Glucose, Bld: 116 — ABNORMAL HIGH
Potassium: 3.8
Potassium: 4.3
Potassium: 4.3
Sodium: 135
Total Bilirubin: 2.5 — ABNORMAL HIGH
Total Protein: 4.9 — ABNORMAL LOW
Total Protein: 5 — ABNORMAL LOW
Total Protein: 6.9

## 2011-09-09 LAB — BASIC METABOLIC PANEL
CO2: 25
Chloride: 100
GFR calc non Af Amer: 31 — ABNORMAL LOW
Glucose, Bld: 123 — ABNORMAL HIGH
Potassium: 4.9
Sodium: 129 — ABNORMAL LOW

## 2011-09-09 LAB — IRON AND TIBC: Iron: <10 — ABNORMAL LOW

## 2011-09-09 LAB — HEPATIC FUNCTION PANEL
ALT: 682 — ABNORMAL HIGH
AST: 822 — ABNORMAL HIGH
Indirect Bilirubin: 1 — ABNORMAL HIGH
Total Protein: 5.9 — ABNORMAL LOW

## 2011-09-09 LAB — HEMOGLOBIN AND HEMATOCRIT, BLOOD: Hemoglobin: 10.3 — ABNORMAL LOW

## 2011-09-09 LAB — B-NATRIURETIC PEPTIDE (CONVERTED LAB): Pro B Natriuretic peptide (BNP): 507 — ABNORMAL HIGH

## 2011-09-09 LAB — DIFFERENTIAL
Eosinophils Relative: 0
Lymphocytes Relative: 1 — ABNORMAL LOW
Lymphs Abs: 0.1 — ABNORMAL LOW
Monocytes Absolute: 0 — ABNORMAL LOW
Monocytes Relative: 0 — ABNORMAL LOW
Neutro Abs: 11.3 — ABNORMAL HIGH

## 2011-09-09 LAB — POCT CARDIAC MARKERS
CKMB, poc: 1.9
Myoglobin, poc: 209
Myoglobin, poc: >500
Operator id: 277751
Troponin i, poc: <0.05

## 2011-09-09 LAB — URINE MICROSCOPIC-ADD ON

## 2011-09-09 LAB — CROSSMATCH
ABO/RH(D): A POS
Antibody Screen: NEGATIVE

## 2011-09-09 LAB — VITAMIN B12: Vitamin B-12: 483 (ref 211–911)

## 2011-09-09 LAB — RETICULOCYTES
RBC.: 3.33 — ABNORMAL LOW
Retic Ct Pct: 0.7

## 2011-09-09 LAB — LIPID PANEL: Triglycerides: 47

## 2011-09-09 LAB — APTT: aPTT: 31

## 2011-09-09 LAB — CK TOTAL AND CKMB (NOT AT ARMC): Relative Index: 0.7

## 2011-09-09 LAB — TROPONIN I: Troponin I: 0.09 — ABNORMAL HIGH

## 2011-09-10 LAB — COMPREHENSIVE METABOLIC PANEL
ALT: 20
Alkaline Phosphatase: 56
BUN: 21
CO2: 26
Chloride: 106
GFR calc non Af Amer: 45 — ABNORMAL LOW
Glucose, Bld: 81
Potassium: 4.8
Sodium: 139
Total Bilirubin: 0.8

## 2011-09-10 LAB — PROTIME-INR
INR: 1.3
INR: 1.7 — ABNORMAL HIGH
Prothrombin Time: 23.3 — ABNORMAL HIGH
Prothrombin Time: 13.9

## 2011-09-10 LAB — CBC
HCT: 25.8 — ABNORMAL LOW
HCT: 28 — ABNORMAL LOW
HCT: 36.7 — ABNORMAL LOW
Hemoglobin: 9.1 — ABNORMAL LOW
Hemoglobin: 12.5 — ABNORMAL LOW
MCHC: 34.3
MCHC: 34.3
MCHC: 34.7
MCV: 89.5
MCV: 90.3
MCV: 90.5
Platelets: 176
Platelets: 182
RBC: 2.86 — ABNORMAL LOW
RBC: 4.1 — ABNORMAL LOW
RDW: 14.2 — ABNORMAL HIGH
RDW: 14.4 — ABNORMAL HIGH
WBC: 7.9
WBC: 8.1

## 2011-09-10 LAB — BASIC METABOLIC PANEL
BUN: 17
CO2: 24
Calcium: 8 — ABNORMAL LOW
Chloride: 105
Chloride: 105
Creatinine, Ser: 1.49
Creatinine, Ser: 1.69 — ABNORMAL HIGH
Glucose, Bld: 109 — ABNORMAL HIGH
Glucose, Bld: 124 — ABNORMAL HIGH
Sodium: 134 — ABNORMAL LOW

## 2011-09-10 LAB — URINALYSIS, ROUTINE W REFLEX MICROSCOPIC
Glucose, UA: NEGATIVE
Nitrite: NEGATIVE
Specific Gravity, Urine: 1.011
pH: 6

## 2011-09-10 LAB — TYPE AND SCREEN
ABO/RH(D): A POS
Antibody Screen: NEGATIVE

## 2011-10-18 ENCOUNTER — Encounter: Payer: Self-pay | Admitting: Surgery

## 2011-11-05 ENCOUNTER — Encounter: Payer: Self-pay | Admitting: Surgery

## 2011-11-08 ENCOUNTER — Other Ambulatory Visit (INDEPENDENT_AMBULATORY_CARE_PROVIDER_SITE_OTHER): Payer: Medicare Other | Admitting: Vascular Surgery

## 2011-11-08 ENCOUNTER — Ambulatory Visit (INDEPENDENT_AMBULATORY_CARE_PROVIDER_SITE_OTHER): Payer: Medicare Other | Admitting: Surgery

## 2011-11-08 ENCOUNTER — Encounter: Payer: Self-pay | Admitting: Surgery

## 2011-11-08 VITALS — BP 144/84 | HR 64 | Resp 16 | Ht 66.0 in | Wt 164.0 lb

## 2011-11-08 DIAGNOSIS — I6529 Occlusion and stenosis of unspecified carotid artery: Secondary | ICD-10-CM

## 2011-11-08 NOTE — Progress Notes (Signed)
[facility] [place]   Patient name: Glenn Meza MRN: [id number] DOB: 24-Jul-1927 Sex: male     Chief Complaint  Patient presents with  . Carotid    one year f/up    HISTORY OF PRESENT ILLNESS: The patient is back today for followup of his carotid disease. He has been undergoing yearly carotid surveillance. He has a history of a cerebellar stroke. He has had some issues with balance thought to be attributed to his cerebellar stroke. The patient denies having any numbness or weakness in either extremity. He denies slurred speech. He denies amaurosis fugax. He continues to take his statins for his hypercholesterolemia as well as his antihypertensive medication. He states his blood pressure is usually in the 120s over 80s  Past Medical History  Diagnosis Date  . Hypertension   . Hyperlipidemia   . Myocardial infarction 06/1991  . Stroke   . CAD (coronary artery disease)     History reviewed. No pertinent past surgical history.  History   Social History  . Marital Status: Widowed    Spouse Name: N/A    Number of Children: N/A  . Years of Education: N/A   Occupational History  . Not on file.   Social History Main Topics  . Smoking status: Former Smoker    Types: Cigarettes  . Smokeless tobacco: Not on file  . Alcohol Use: No  . Drug Use:   . Sexually Active:    Other Topics Concern  . Not on file   Social History Narrative  . No narrative on file    Family History  Problem Relation Age of Onset  . Heart disease Mother   . Cancer Father     Allergies as of 11/08/2011  . (No Known Allergies)    Current Outpatient Prescriptions on File Prior to Visit  Medication Sig Dispense Refill  . aspirin EC 81 MG tablet Take 81 mg by mouth daily.        . clopidogrel (PLAVIX) 75 MG tablet Take 75 mg by mouth daily.        . Famotidine (PEPCID PO) Take by mouth.        . Multiple Vitamin (MULTIVITAMIN) capsule Take 1 capsule by mouth daily.        .  rosuvastatin (CRESTOR) 10 MG tablet Take 10 mg by mouth daily.        . valsartan (DIOVAN) 160 MG tablet Take 160 mg by mouth daily.           REVIEW OF SYSTEMS: No chest pain or shortness of breath no cough no numbness or weakness. All other systems are negative as documented in the counter form  PHYSICAL EXAMINATION:   Vital signs are BP 144/84  Pulse 64  Resp 16  Ht 5\' 6"  (1.676 m)  Wt 164 lb (74.39 kg)  BMI 26.47 kg/m2  SpO2 99% General: The patient appears their stated age. HEENT:  No gross abnormalities Pulmonary:  Non labored breathing Musculoskeletal: There are no major deformities. Neurologic: No focal weakness or paresthesias are detected, Skin: There are no ulcer or rashes noted. Psychiatric: The patient has normal affect. Cardiovascular: There is a regular rate and rhythm without significant murmur appreciated. No carotid bruits   Diagnostic Studies Ultrasound was performed today which shows essentially no change since last year. He has bilateral 40-59% stenosis  Assessment: Asymptomatic mild to moderate bilateral carotid stenosis Plan: We will continue to treat the patient medically including antiplatelet medication cholesterol  lowering agents and low pressure medicine. I scheduled him to come back for repeat ultrasound in one year  V. Charlena Cross, M.D. [facility] [place] Office: [phone] Pager:  [phone]

## 2011-11-15 NOTE — Procedures (Unsigned)
CAROTID DUPLEX EXAM  INDICATION:  Carotid stenosis.  HISTORY: Diabetes:  No. Cardiac:  MI in 1992. Hypertension:  Yes. Smoking:  Previous. Previous Surgery:  No carotid intervention. CV History:  Unsteady gait. Amaurosis Fugax No, Paresthesias No, Hemiparesis No.                                      RIGHT             LEFT Brachial systolic pressure:         136               128 Brachial Doppler waveforms:         WNL               WNL Vertebral direction of flow:        Antegrade         Antegrade DUPLEX VELOCITIES (cm/sec) CCA peak systolic                   93                78 ECA peak systolic                   176               125 ICA peak systolic                   180               127 ICA end diastolic                   60                44 PLAQUE MORPHOLOGY:                  Heterogenous      Calcified PLAQUE AMOUNT:                      Moderate to severe                  Moderate PLAQUE LOCATION:                    CCA/ICA/ECA       CCA/ICA  IMPRESSION: 1. Right internal carotid artery stenosis present in the 40% to 59%     range (high end of range). 2. Right external carotid artery stenosis present. 3. Left internal carotid artery stenosis present in the 40% to 59%     range. 4. Essentially unchanged since previous study on 11/09/2010.  ___________________________________________ V. Charlena Cross, MD  SH/MEDQ  D:  11/08/2011  T:  11/08/2011  Job:  [id number]

## 2012-10-18 ENCOUNTER — Ambulatory Visit: Payer: Medicare Other | Admitting: Neurosurgery

## 2012-10-18 ENCOUNTER — Other Ambulatory Visit: Payer: Medicare Other

## 2012-10-20 ENCOUNTER — Encounter: Payer: Self-pay | Admitting: Neurosurgery

## 2012-10-23 ENCOUNTER — Ambulatory Visit (INDEPENDENT_AMBULATORY_CARE_PROVIDER_SITE_OTHER): Payer: Medicare Other | Admitting: Neurosurgery

## 2012-10-23 ENCOUNTER — Other Ambulatory Visit (INDEPENDENT_AMBULATORY_CARE_PROVIDER_SITE_OTHER): Payer: Medicare Other | Admitting: *Deleted

## 2012-10-23 ENCOUNTER — Encounter: Payer: Self-pay | Admitting: Neurosurgery

## 2012-10-23 VITALS — BP 103/64 | HR 73 | Resp 16 | Ht 66.5 in | Wt 162.6 lb

## 2012-10-23 DIAGNOSIS — I6529 Occlusion and stenosis of unspecified carotid artery: Secondary | ICD-10-CM

## 2012-10-23 NOTE — Progress Notes (Signed)
[facility] [place] Carotid Office Note  CC: Carotid surveillance Referring Physician: Brabham  History of Present Illness: 78 year old male patient of Dr. Myra Gianotti followed for known carotid stenosis. The patient denies any signs or symptoms of CVA, TIA, amaurosis fugax or any neural deficit. The patient does state that he had a new diagnosis of bilateral glaucoma about 6 weeks ago and is under treatment for that.  Past Medical History  Diagnosis Date  . Hypertension   . Hyperlipidemia   . Myocardial infarction 06/1991  . Stroke   . CAD (coronary artery disease)   . Carotid artery occlusion     ROS: [x]  Positive   [ ]  Denies    General: [ ]  Weight loss, [ ]  Fever, [ ]  chills Neurologic: [ ]  Dizziness, [ ]  Blackouts, [ ]  Seizure [ ]  Stroke, [ ]  "Mini stroke", [ ]  Slurred speech, [ ]  Temporary blindness; [ ]  weakness in arms or legs, [ ]  Hoarseness Cardiac: [ ]  Chest pain/pressure, [ ]  Shortness of breath at rest [ ]  Shortness of breath with exertion, [ ]  Atrial fibrillation or irregular heartbeat Vascular: [ ]  Pain in legs with walking, [ ]  Pain in legs at rest, [ ]  Pain in legs at night,  [ ]  Non-healing ulcer, [ ]  Blood clot in vein/DVT,   Pulmonary: [ ]  Home oxygen, [ ]  Productive cough, [ ]  Coughing up blood, [ ]  Asthma,  [ ]  Wheezing Musculoskeletal:  [ ]  Arthritis, [ ]  Low back pain, [ ]  Joint pain Hematologic: [ ]  Easy Bruising, [ ]  Anemia; [ ]  Hepatitis Gastrointestinal: [ ]  Blood in stool, [ ]  Gastroesophageal Reflux/heartburn, [ ]  Trouble swallowing Urinary: [ ]  chronic Kidney disease, [ ]  on HD - [ ]  MWF or [ ]  TTHS, [ ]  Burning with urination, [ ]  Difficulty urinating Skin: [ ]  Rashes, [ ]  Wounds Psychological: [ ]  Anxiety, [ ]  Depression   Social History History  Substance Use Topics  . Smoking status: Former Smoker    Types: Cigarettes  . Smokeless tobacco: Never Used  . Alcohol Use: No    Family History Family History  Problem Relation  Age of Onset  . Heart disease Mother   . Cancer Father     No Known Allergies  Current Outpatient Prescriptions  Medication Sig Dispense Refill  . amLODipine (NORVASC) 10 MG tablet Take 10 mg by mouth daily.      Marland Kitchen aspirin EC 81 MG tablet Take 81 mg by mouth daily.        . clopidogrel (PLAVIX) 75 MG tablet Take 75 mg by mouth daily.        . Famotidine (PEPCID PO) Take by mouth.        . Iron-DSS-B12-FA-C-E-Cu-Biotin (HEMAX) 150-1 MG TABS Take by mouth daily.      Marland Kitchen latanoprost (XALATAN) 0.005 % ophthalmic solution 1 drop at bedtime.      . Multiple Vitamin (MULTIVITAMIN) capsule Take 1 capsule by mouth daily.        . rosuvastatin (CRESTOR) 10 MG tablet Take 10 mg by mouth daily.        . valsartan (DIOVAN) 160 MG tablet Take 160 mg by mouth daily.          Physical Examination  Filed Vitals:   10/23/12 1124  BP: 103/64  Pulse: 73  Resp:     Body mass index is 25.85 kg/(m^2).  General:  WDWN in NAD Gait: Normal HEENT: WNL Eyes: Pupils equal  Pulmonary: normal non-labored breathing , without Rales, rhonchi,  wheezing Cardiac: RRR, without  Murmurs, rubs or gallops; Abdomen: soft, NT, no masses Skin: no rashes, ulcers noted  Vascular Exam Pulses: 2+ radial pulses bilateral Carotid bruits: Carotid bruit heard on the right, carotid pulse on the left Extremities without ischemic changes, no Gangrene , no cellulitis; no open wounds;  Musculoskeletal: no muscle wasting or atrophy   Neurologic: A&O X 3; Appropriate Affect ; SENSATION: normal; MOTOR FUNCTION:  moving all extremities equally. Speech is fluent/normal  Non-Invasive Vascular Imaging CAROTID DUPLEX 10/23/2012  Right ICA 60 - 79 % stenosis Left ICA 20 - 39 % stenosis   ASSESSMENT/PLAN: Asymptomatic patient with increased right ICA stenosis since last visit, the patient has been downgraded on the left. The patient knows the signs and symptoms of CVA and knows to report to the nearest emergency department should  that occur. The patient's questions were encouraged and answered, he will followup in 6 months with repeat carotid duplex and he is in agreement with this plan.  Lauree Chandler ANP   Clinic MD: Myra Gianotti

## 2012-10-25 NOTE — Addendum Note (Signed)
Addended by: Sharee Pimple on: 10/25/2012 01:54 PM   Modules accepted: Orders

## 2013-04-27 ENCOUNTER — Encounter: Payer: Self-pay | Admitting: Neurosurgery

## 2013-04-30 ENCOUNTER — Other Ambulatory Visit (INDEPENDENT_AMBULATORY_CARE_PROVIDER_SITE_OTHER): Payer: Medicare Other | Admitting: *Deleted

## 2013-04-30 ENCOUNTER — Ambulatory Visit: Payer: Medicare Other | Admitting: Neurosurgery

## 2013-04-30 DIAGNOSIS — I6529 Occlusion and stenosis of unspecified carotid artery: Secondary | ICD-10-CM

## 2013-05-01 ENCOUNTER — Other Ambulatory Visit: Payer: Self-pay | Admitting: *Deleted

## 2013-05-03 ENCOUNTER — Encounter: Payer: Self-pay | Admitting: Surgery

## 2013-07-17 ENCOUNTER — Other Ambulatory Visit: Payer: Self-pay | Admitting: Internal Medicine

## 2013-07-17 DIAGNOSIS — R2681 Unsteadiness on feet: Secondary | ICD-10-CM

## 2013-07-20 ENCOUNTER — Ambulatory Visit
Admission: RE | Admit: 2013-07-20 | Discharge: 2013-07-20 | Disposition: A | Discharge status: Home / Self Care | Payer: Medicare Other | Source: Ambulatory Visit | Attending: Internal Medicine | Admitting: Internal Medicine

## 2013-07-20 DIAGNOSIS — R2681 Unsteadiness on feet: Secondary | ICD-10-CM

## 2013-11-02 ENCOUNTER — Encounter: Payer: Self-pay | Admitting: Family

## 2013-11-05 ENCOUNTER — Other Ambulatory Visit (HOSPITAL_COMMUNITY): Payer: Medicare Other

## 2013-11-05 ENCOUNTER — Encounter: Payer: Self-pay | Admitting: Family

## 2013-11-05 ENCOUNTER — Ambulatory Visit (HOSPITAL_COMMUNITY)
Admission: RE | Admit: 2013-11-05 | Discharge: 2013-11-05 | Disposition: A | Discharge status: Home / Self Care | Payer: Medicare Other | Source: Ambulatory Visit | Attending: Family | Admitting: Family

## 2013-11-05 ENCOUNTER — Ambulatory Visit (INDEPENDENT_AMBULATORY_CARE_PROVIDER_SITE_OTHER): Payer: Medicare Other | Admitting: Family

## 2013-11-05 DIAGNOSIS — I6529 Occlusion and stenosis of unspecified carotid artery: Secondary | ICD-10-CM | POA: Insufficient documentation

## 2013-11-05 DIAGNOSIS — I658 Occlusion and stenosis of other precerebral arteries: Secondary | ICD-10-CM | POA: Insufficient documentation

## 2013-11-05 NOTE — Addendum Note (Signed)
Addended by: Dannielle Karvonen on: 11/05/2013 03:43 PM   Modules accepted: Orders

## 2013-11-05 NOTE — Patient Instructions (Signed)
Stroke Prevention Some medical conditions and behaviors are associated with an increased chance of having a stroke. You may prevent a stroke by making healthy choices and managing medical conditions. Reduce your risk of having a stroke by:  Staying physically active. Get at least 30 minutes of activity on most or all days.  Not smoking. It may also be helpful to avoid exposure to secondhand smoke.  Limiting alcohol use. Moderate alcohol use is considered to be:  No more than 2 drinks per day for men.  No more than 1 drink per day for nonpregnant women.  Eating healthy foods.  Include 5 or more servings of fruits and vegetables a day.  Certain diets may be prescribed to address high blood pressure, high cholesterol, diabetes, or obesity.  Managing your cholesterol levels.  A low-saturated fat, low-trans fat, low-cholesterol, and high-fiber diet may control cholesterol levels.  Take any prescribed medicines to control cholesterol as directed by your caregiver.  Managing your diabetes.  A controlled-carbohydrate, controlled-sugar diet is recommended to manage diabetes.  Take any prescribed medicines to control diabetes as directed by your caregiver.  Controlling your high blood pressure (hypertension).  A low-salt (sodium), low-saturated fat, low-trans fat, and low-cholesterol diet is recommended to manage high blood pressure.  Take any prescribed medicines to control hypertension as directed by your caregiver.  Maintaining a healthy weight.  A reduced-calorie, low-sodium, low-saturated fat, low-trans fat, low-cholesterol diet is recommended to manage weight.  Stopping drug abuse.  Avoiding birth control pills.  Talk to your caregiver about the risks of taking birth control pills if you are over 35 years old, smoke, get migraines, or have ever had a blood clot.  Getting evaluated for sleep disorders (sleep apnea).  Talk to your caregiver about getting a sleep evaluation  if you snore a lot or have excessive sleepiness.  Taking medicines as directed by your caregiver.  For some people, aspirin or blood thinners (anticoagulants) are helpful in reducing the risk of forming abnormal blood clots that can lead to stroke. If you have the irregular heart rhythm of atrial fibrillation, you should be on a blood thinner unless there is a good reason you cannot take them.  Understand all your medicine instructions. SEEK IMMEDIATE MEDICAL CARE IF:   You have sudden weakness or numbness of the face, arm, or leg, especially on one side of the body.  You have sudden confusion.  You have trouble speaking (aphasia) or understanding.  You have sudden trouble seeing in one or both eyes.  You have sudden trouble walking.  You have dizziness.  You have a loss of balance or coordination.  You have a sudden, severe headache with no known cause.  You have new chest pain or an irregular heartbeat. Any of these symptoms may represent a serious problem that is an emergency. Do not wait to see if the symptoms will go away. Get medical help right away. Call your local emergency services (911 in [place] Do not drive yourself to the hospital. Document Released: 12/23/2004 Document Revised: 02/07/2012 Document Reviewed: 05/18/2013 [employer] Patient Information 2014 [employer].  

## 2013-11-05 NOTE — Progress Notes (Signed)
Established Carotid Patient  Previous Carotid surgery: No  History of Present Illness  Glenn Meza is a 79 y.o. male patient of Dr. Myra Gianotti followed for known carotid stenosis.  Patient has Positive history of TIA symptom about 2010 as manifested by loss of balance for a couple of weeks, underwent physical therapy and has no more balance problems.   The patient denies amaurosis fugax or monocular blindness.  The patient  denies facial drooping.  Pt. denies hemiplegia.  The patient denies receptive or expressive aphasia.  Pt. denies extremity weakness.   Patient denies New Medical or Surgical History. Patient states Dr. Jacinto Halim requested an [place] of his legs which was done in Dr. Verl Dicker office, and patient will learn the results in 3 days when he sees Dr. Jacinto Halim.   Pt Diabetic: No Pt smoker: former smoker, quit 10-12 years ago  Pt meds include: Statin : Yes ASA: Yes Other anticoagulants/antiplatelets: Plavix   Past Medical History  Diagnosis Date  . Hypertension   . Hyperlipidemia   . Myocardial infarction 06/1991  . Stroke   . CAD (coronary artery disease)   . Carotid artery occlusion     Social History History  Substance Use Topics  . Smoking status: Former Smoker    Types: Cigarettes  . Smokeless tobacco: Never Used  . Alcohol Use: No    Family History Family History  Problem Relation Age of Onset  . Heart disease Mother   . Cancer Father     Surgical History No past surgical history on file.  No Known Allergies  Current Outpatient Prescriptions  Medication Sig Dispense Refill  . amLODipine (NORVASC) 10 MG tablet Take 10 mg by mouth daily.      Marland Kitchen aspirin EC 81 MG tablet Take 81 mg by mouth daily.        . clopidogrel (PLAVIX) 75 MG tablet Take 75 mg by mouth daily.        . Famotidine (PEPCID PO) Take by mouth.        . Iron-DSS-B12-FA-C-E-Cu-Biotin (HEMAX) 150-1 MG TABS Take by mouth daily.      Marland Kitchen latanoprost (XALATAN) 0.005 % ophthalmic solution 1 drop at  bedtime.      . Multiple Vitamin (MULTIVITAMIN) capsule Take 1 capsule by mouth daily.        . rosuvastatin (CRESTOR) 10 MG tablet Take 10 mg by mouth daily.        . valsartan (DIOVAN) 160 MG tablet Take 160 mg by mouth daily.         No current facility-administered medications for this visit.    Review of Systems : [x]  Positive   [ ]  Denies  General:[ ]  Weight loss,  [ ]  Weight gain, [ ]  Loss of appetite, [ ]  Fever, [ ]  chills  Neurologic: [ ]  Dizziness, [ ]  Blackouts, [ ]  Headaches, [ ]  Seizure [ ]  Stroke, [ ]  "Mini stroke", [ ]  Slurred speech, [ ]  Temporary blindness;  [ ] weakness,  Ear/Nose/Throat: [ ]  Change in hearing, [ ]  Nose bleeds, [ ]  Hoarseness  Vascular:[ ]  Pain in legs with walking, [ ]  Pain in feet while lying flat , [ ]   Non-healing ulcer, [ ]  Blood clot in vein,    Pulmonary: [ ]  Home oxygen, [ ]   Productive cough, [ ]  Bronchitis, [ ]  Coughing up blood,  [ ]  Asthma, [ ]  Wheezing  Musculoskeletal:  [ ]  Arthritis, [ ]  Joint pain, [ ]  low back pain  Cardiac: [ ]   Chest pain, [ ]  Shortness of breath when lying flat, [ ]  Shortness of breath with exertion, [ ]  Palpitations, [ ]  Heart murmur, [ ]   Atrial fibrillation  Hematologic:[ ]  Easy Bruising, [ ]  Anemia; [ ]  Hepatitis  Psychiatric: [ ]   Depression, [ ]  Anxiety   Gastrointestinal: [ ]  Black stool, [ ]  Blood in stool, [ ]  Peptic ulcer disease,  [ ]  Gastroesophageal Reflux, [ ]  Trouble swallowing, [ ]  Diarrhea, [ ]  Constipation  Urinary: [ ]  chronic Kidney disease, [ ]  on HD, [ ]  Burning with urination, [ ]  Frequent urination, [ ]  Difficulty urinating;   Skin: [ ]  Rashes, [ ]  Wounds    Physical Examination  Filed Vitals:   11/05/13 1439  BP: 128/79  Pulse: 74  Resp: 14   Filed Weights   11/05/13 1439  Weight: 172 lb (78.019 kg)    Body mass index is 27.35 kg/(m^2).  General: WDWN male in NAD GAIT: stiff Eyes: PERRLA Pulmonary:  CTAB, Negative  Rales, Negative rhonchi, & Negative  wheezing.  Cardiac: regular Rhythm ,  Negative Murmurs.  VASCULAR EXAM Carotid Bruits Left Right   Negative Negative    Radial pulses are 2+ palpable and equal.                                                                                                                            LE Pulses LEFT RIGHT       POSTERIOR TIBIAL   palpable    palpable        DORSALIS PEDIS      ANTERIOR TIBIAL not palpable  not palpable     Gastrointestinal: soft, nontender, BS WNL, no r/g,  negative masses.  Musculoskeletal: Negative muscle atrophy/wasting. M/S 3/5 throughout, Extremities without ischemic changes.  Neurologic: A&O X 3; Appropriate Affect ; SENSATION ;normal;  Speech is normal CN 2-12 intact, Pain and light touch intact in extremities, Motor exam as listed above.   Non-Invasive Vascular Imaging CAROTID DUPLEX 11/05/2013   Right ICA: 40 - 59 % stenosis. Left ICA: 40 - 59 % stenosis.  These findings are Unchanged from previous exam.  Assessment: Glenn Meza is a 79 y.o. male who presents with asymptomatic40 - 59 % Bilateral ICA  Stenosis. The  ICA stenosis is  Unchanged from previous exam.  Plan: Follow-up in 1 year with Carotid Duplex scan.   I discussed in depth with the patient the nature of atherosclerosis, and emphasized the importance of maximal medical management including strict control of blood pressure, blood glucose, and lipid levels, obtaining regular exercise, and continued cessation of smoking.  The patient is aware that without maximal medical management the underlying atherosclerotic disease process will progress, limiting the benefit of any interventions. The patient was given information about stroke prevention and what symptoms should prompt the patient to seek immediate medical care. Thank you for allowing [place] to participate in this patient's care.  Rosalita Chessman Analiah Drum, RN, MSN, FNP-C  [facility] Office:  [phone]  Clinic Physician: Hart Rochester  11/05/2013 1:26 PM

## 2014-07-01 ENCOUNTER — Emergency Department (HOSPITAL_COMMUNITY): Payer: Medicare Other

## 2014-07-01 ENCOUNTER — Encounter (HOSPITAL_COMMUNITY): Payer: Self-pay | Admitting: Emergency Medicine

## 2014-07-01 DIAGNOSIS — E8809 Other disorders of plasma-protein metabolism, not elsewhere classified: Secondary | ICD-10-CM | POA: Diagnosis present

## 2014-07-01 DIAGNOSIS — I472 Ventricular tachycardia, unspecified: Secondary | ICD-10-CM | POA: Diagnosis not present

## 2014-07-01 DIAGNOSIS — Z79899 Other long term (current) drug therapy: Secondary | ICD-10-CM

## 2014-07-01 DIAGNOSIS — E875 Hyperkalemia: Secondary | ICD-10-CM | POA: Diagnosis not present

## 2014-07-01 DIAGNOSIS — R55 Syncope and collapse: Secondary | ICD-10-CM

## 2014-07-01 DIAGNOSIS — N184 Chronic kidney disease, stage 4 (severe): Secondary | ICD-10-CM | POA: Diagnosis present

## 2014-07-01 DIAGNOSIS — Z87891 Personal history of nicotine dependence: Secondary | ICD-10-CM

## 2014-07-01 DIAGNOSIS — Z66 Do not resuscitate: Secondary | ICD-10-CM | POA: Diagnosis present

## 2014-07-01 DIAGNOSIS — I214 Non-ST elevation (NSTEMI) myocardial infarction: Secondary | ICD-10-CM | POA: Diagnosis present

## 2014-07-01 DIAGNOSIS — N179 Acute kidney failure, unspecified: Secondary | ICD-10-CM | POA: Diagnosis present

## 2014-07-01 DIAGNOSIS — E872 Acidosis, unspecified: Secondary | ICD-10-CM | POA: Diagnosis present

## 2014-07-01 DIAGNOSIS — I482 Chronic atrial fibrillation, unspecified: Secondary | ICD-10-CM

## 2014-07-01 DIAGNOSIS — I959 Hypotension, unspecified: Secondary | ICD-10-CM

## 2014-07-01 DIAGNOSIS — K922 Gastrointestinal hemorrhage, unspecified: Secondary | ICD-10-CM | POA: Diagnosis present

## 2014-07-01 DIAGNOSIS — D62 Acute posthemorrhagic anemia: Secondary | ICD-10-CM | POA: Diagnosis present

## 2014-07-01 DIAGNOSIS — R0902 Hypoxemia: Secondary | ICD-10-CM

## 2014-07-01 DIAGNOSIS — J811 Chronic pulmonary edema: Secondary | ICD-10-CM | POA: Diagnosis present

## 2014-07-01 DIAGNOSIS — J9600 Acute respiratory failure, unspecified whether with hypoxia or hypercapnia: Secondary | ICD-10-CM | POA: Diagnosis present

## 2014-07-01 DIAGNOSIS — I4729 Other ventricular tachycardia: Secondary | ICD-10-CM | POA: Diagnosis not present

## 2014-07-01 DIAGNOSIS — R4189 Other symptoms and signs involving cognitive functions and awareness: Secondary | ICD-10-CM | POA: Diagnosis present

## 2014-07-01 DIAGNOSIS — I469 Cardiac arrest, cause unspecified: Secondary | ICD-10-CM

## 2014-07-01 DIAGNOSIS — E785 Hyperlipidemia, unspecified: Secondary | ICD-10-CM | POA: Diagnosis present

## 2014-07-01 DIAGNOSIS — D72829 Elevated white blood cell count, unspecified: Secondary | ICD-10-CM | POA: Diagnosis present

## 2014-07-01 DIAGNOSIS — Z7902 Long term (current) use of antithrombotics/antiplatelets: Secondary | ICD-10-CM

## 2014-07-01 DIAGNOSIS — I25810 Atherosclerosis of coronary artery bypass graft(s) without angina pectoris: Secondary | ICD-10-CM | POA: Diagnosis present

## 2014-07-01 DIAGNOSIS — I252 Old myocardial infarction: Secondary | ICD-10-CM

## 2014-07-01 DIAGNOSIS — I2510 Atherosclerotic heart disease of native coronary artery without angina pectoris: Secondary | ICD-10-CM | POA: Diagnosis present

## 2014-07-01 DIAGNOSIS — Z515 Encounter for palliative care: Secondary | ICD-10-CM

## 2014-07-01 DIAGNOSIS — I2589 Other forms of chronic ischemic heart disease: Secondary | ICD-10-CM | POA: Diagnosis present

## 2014-07-01 DIAGNOSIS — J441 Chronic obstructive pulmonary disease with (acute) exacerbation: Secondary | ICD-10-CM | POA: Diagnosis present

## 2014-07-01 DIAGNOSIS — I509 Heart failure, unspecified: Secondary | ICD-10-CM | POA: Diagnosis present

## 2014-07-01 DIAGNOSIS — I059 Rheumatic mitral valve disease, unspecified: Secondary | ICD-10-CM | POA: Diagnosis present

## 2014-07-01 DIAGNOSIS — N189 Chronic kidney disease, unspecified: Secondary | ICD-10-CM

## 2014-07-01 DIAGNOSIS — J9601 Acute respiratory failure with hypoxia: Secondary | ICD-10-CM | POA: Diagnosis present

## 2014-07-01 DIAGNOSIS — R570 Cardiogenic shock: Secondary | ICD-10-CM | POA: Diagnosis present

## 2014-07-01 DIAGNOSIS — D500 Iron deficiency anemia secondary to blood loss (chronic): Secondary | ICD-10-CM | POA: Diagnosis present

## 2014-07-01 DIAGNOSIS — Z9861 Coronary angioplasty status: Secondary | ICD-10-CM

## 2014-07-01 DIAGNOSIS — I4891 Unspecified atrial fibrillation: Secondary | ICD-10-CM | POA: Diagnosis present

## 2014-07-01 DIAGNOSIS — I129 Hypertensive chronic kidney disease with stage 1 through stage 4 chronic kidney disease, or unspecified chronic kidney disease: Secondary | ICD-10-CM | POA: Diagnosis present

## 2014-07-01 HISTORY — DX: Atherosclerotic heart disease of native coronary artery without angina pectoris: I25.10

## 2014-07-01 HISTORY — DX: Pure hypercholesterolemia, unspecified: E78.00

## 2014-07-01 LAB — CBC WITH DIFFERENTIAL/PLATELET
BASOS ABS: 0 10*3/uL (ref 0.0–0.1)
Basophils Relative: 0 % (ref 0–1)
Eosinophils Absolute: 0.1 10*3/uL (ref 0.0–0.7)
Eosinophils Relative: 0 % (ref 0–5)
HCT: 28.3 % — ABNORMAL LOW (ref 39.0–52.0)
Hemoglobin: 8.8 g/dL — ABNORMAL LOW (ref 13.0–17.0)
LYMPHS PCT: 6 % — AB (ref 12–46)
Lymphs Abs: 1 10*3/uL (ref 0.7–4.0)
MCH: 27.6 pg (ref 26.0–34.0)
MCHC: 31.1 g/dL (ref 30.0–36.0)
MCV: 88.7 fL (ref 78.0–100.0)
MONO ABS: 1.4 10*3/uL — AB (ref 0.1–1.0)
Monocytes Relative: 8 % (ref 3–12)
NEUTROS ABS: 15.6 10*3/uL — AB (ref 1.7–7.7)
NEUTROS PCT: 86 % — AB (ref 43–77)
Platelets: 334 10*3/uL (ref 150–400)
RBC: 3.19 MIL/uL — AB (ref 4.22–5.81)
RDW: 14.7 % (ref 11.5–15.5)
WBC: 18.1 10*3/uL — AB (ref 4.0–10.5)

## 2014-07-01 LAB — COMPREHENSIVE METABOLIC PANEL
ALT: 39 U/L (ref 0–53)
AST: 37 U/L (ref 0–37)
Albumin: 3.6 g/dL (ref 3.5–5.2)
Alkaline Phosphatase: 73 U/L (ref 39–117)
Anion gap: 19 — ABNORMAL HIGH (ref 5–15)
BUN: 72 mg/dL — ABNORMAL HIGH (ref 6–23)
CHLORIDE: 111 meq/L (ref 96–112)
CO2: 13 mEq/L — ABNORMAL LOW (ref 19–32)
CREATININE: 3.34 mg/dL — AB (ref 0.50–1.35)
Calcium: 8.4 mg/dL (ref 8.4–10.5)
GFR calc Af Amer: 18 mL/min — ABNORMAL LOW (ref >90)
GFR, EST NON AFRICAN AMERICAN: 15 mL/min — AB (ref >90)
GLUCOSE: 184 mg/dL — AB (ref 70–99)
Potassium: 5.2 mEq/L (ref 3.7–5.3)
Sodium: 143 mEq/L (ref 137–147)
Total Bilirubin: 0.4 mg/dL (ref 0.3–1.2)
Total Protein: 7.1 g/dL (ref 6.0–8.3)

## 2014-07-01 LAB — URINALYSIS, ROUTINE W REFLEX MICROSCOPIC
BILIRUBIN URINE: NEGATIVE
GLUCOSE, UA: NEGATIVE mg/dL
Ketones, ur: NEGATIVE mg/dL
LEUKOCYTES UA: NEGATIVE
Nitrite: NEGATIVE
Protein, ur: 100 mg/dL — AB
SPECIFIC GRAVITY, URINE: 1.02 (ref 1.005–1.030)
Urobilinogen, UA: 1 mg/dL (ref 0.0–1.0)
pH: 5 (ref 5.0–8.0)

## 2014-07-01 LAB — PRO B NATRIURETIC PEPTIDE: Pro B Natriuretic peptide (BNP): 34562 pg/mL — ABNORMAL HIGH (ref 0–450)

## 2014-07-01 LAB — URINE MICROSCOPIC-ADD ON

## 2014-07-01 LAB — I-STAT ARTERIAL BLOOD GAS, ED
Acid-base deficit: 9 mmol/L — ABNORMAL HIGH (ref 0.0–2.0)
Bicarbonate: 15.5 mEq/L — ABNORMAL LOW (ref 20.0–24.0)
O2 Saturation: 93 %
PH ART: 7.328 — AB (ref 7.350–7.450)
PO2 ART: 71 mmHg — AB (ref 80.0–100.0)
TCO2: 16 mmol/L (ref 0–100)
pCO2 arterial: 29.6 mmHg — ABNORMAL LOW (ref 35.0–45.0)

## 2014-07-01 LAB — TROPONIN I: Troponin I: 0.72 ng/mL (ref <0.30)

## 2014-07-01 LAB — POC OCCULT BLOOD, ED
Fecal Occult Bld: NEGATIVE
Fecal Occult Bld: POSITIVE — AB

## 2014-07-01 LAB — I-STAT CG4 LACTIC ACID, ED: Lactic Acid, Venous: 3.05 mmol/L — ABNORMAL HIGH (ref 0.5–2.2)

## 2014-07-01 MED ORDER — DOPAMINE-DEXTROSE 3.2-5 MG/ML-% IV SOLN
2.0000 ug/kg/min | Freq: Once | INTRAVENOUS | Status: DC
  Filled 2014-07-01: qty 250

## 2014-07-01 NOTE — ED Notes (Addendum)
Pt thinks he fell of his bed.  Does not recall any illness.  Family found pt unresponsive, started CPR.  EMS arrived, pt more responsive but could not find radial pulse.  Pt talking to Resident.  Reports two days of syncopal episodes as well. EMS gVE 324 ASPIRIN en route.

## 2014-07-01 NOTE — ED Notes (Signed)
i-stat CG4+ lactic acid result given to Dr. Criss AlvineGoldston

## 2014-07-01 NOTE — ED Notes (Signed)
Given 324 ASA by EMS

## 2014-07-01 NOTE — ED Notes (Signed)
Pt feeling more short of breath.  Changed to NRB, reports makes breathing easier.

## 2014-07-01 NOTE — ED Notes (Signed)
Family at bedside. 

## 2014-07-01 NOTE — ED Provider Notes (Signed)
CSN: [id number]     Arrival date & time 07/15/2014  2120 History   First MD Initiated Contact with Patient July 15, 2014 2128     Chief Complaint  Patient presents with  . Cardiac Arrest     (Consider location/radiation/quality/duration/timing/severity/associated sxs/prior Treatment) HPI  Jerauld Bostwick is a 80 y.o. male with a past medical history of coronary artery disease, hypertension, and high cholesterol, presenting status post cardiac arrest. Per EMS report patient became unresponsive prior to arrival and his son began chest compressions on him. Upon arrival of the EMS to patient's residence, patient was groaning and beginning to become responsive. They were unable to obtain a pulse, and place the patient in Trendelenburg position. Patient began to be more responsive following oxygen administration, and fluid administration. Upon arrival to the emergency department, patient is responsive, but hypotensive with blood pressures in the 80s to 90s systolic. Upon arrival the patient's son, he elicits that the patient has had intermittent episodes of increasing dyspnea over the past few days. Patient also endorses intermittent episodes of syncope, however he had not initially shared this with his family. Patient has a prior history of MI and is followed by Dr. Jacinto Halim.     Past Medical History  Diagnosis Date  . Coronary artery disease   . Hypertension   . Hypercholesterolemia    Past Surgical History  Procedure Laterality Date  . Cardiac surgery     History reviewed. No pertinent family history. History  Substance Use Topics  . Smoking status: Former Games developer  . Smokeless tobacco: Not on file  . Alcohol Use: Not on file    Review of Systems  Constitutional: Positive for diaphoresis.  HENT: Negative.   Eyes: Negative.   Respiratory: Positive for shortness of breath.   Cardiovascular: Negative.   Gastrointestinal: Negative.   Endocrine: Negative.   Genitourinary: Negative.    Musculoskeletal: Negative.   Skin: Negative.   Allergic/Immunologic: Negative.   Neurological: Positive for syncope and light-headedness.  Hematological: Negative.   Psychiatric/Behavioral: Negative.       Allergies  Review of patient's allergies indicates no known allergies.  Home Medications   Prior to Admission medications   Medication Sig Start Date End Date Taking? Authorizing Provider  amLODipine (NORVASC) 2.5 MG tablet Take 2.5 mg by mouth every morning.   Yes Historical Provider, MD  clopidogrel (PLAVIX) 75 MG tablet Take 75 mg by mouth every morning.   Yes Historical Provider, MD  gabapentin (NEURONTIN) 100 MG capsule Take 100 mg by mouth 3 (three) times daily.   Yes Historical Provider, MD  latanoprost (XALATAN) 0.005 % ophthalmic solution Place 1 drop into both eyes at bedtime.   Yes Historical Provider, MD  levothyroxine (SYNTHROID, LEVOTHROID) 50 MCG tablet Take 50 mcg by mouth daily before breakfast.   Yes Historical Provider, MD  rosuvastatin (CRESTOR) 10 MG tablet Take 10 mg by mouth every evening.   Yes Historical Provider, MD  valsartan (DIOVAN) 160 MG tablet Take 160 mg by mouth every morning.   Yes Historical Provider, MD  Vitamin D, Ergocalciferol, (DRISDOL) 50000 UNITS CAPS capsule Take 50,000 Units by mouth every Friday.    Yes Historical Provider, MD   BP 91/62  Pulse 62  Temp(Src) 98.9 F (37.2 C) (Rectal)  Resp 17  Ht 6\' 5"  (1.956 m)  Wt 170 lb (77.111 kg)  BMI 20.15 kg/m2  SpO2 88% Physical Exam  Nursing note and vitals reviewed. Constitutional: He is oriented to person, place, and time. He appears  well-developed and well-nourished. He appears distressed.  HENT:  Head: Normocephalic and atraumatic.  Right Ear: External ear normal.  Left Ear: External ear normal.  Nose: Nose normal.  Mouth/Throat: Oropharynx is clear and moist. No oropharyngeal exudate.  Eyes: Conjunctivae and EOM are normal. Pupils are equal, round, and reactive to light. Right  eye exhibits no discharge. Left eye exhibits no discharge. No scleral icterus.  Neck: Normal range of motion. Neck supple. No JVD present. No tracheal deviation present. No thyromegaly present.  Cardiovascular: Normal rate, normal heart sounds and intact distal pulses.  An irregularly irregular rhythm present. Exam reveals no gallop and no friction rub.   No murmur heard. Pulmonary/Chest: Effort normal and breath sounds normal. No stridor. No respiratory distress. He has no wheezes. He has no rales. He exhibits no tenderness.  Abdominal: Soft. Bowel sounds are normal. He exhibits no distension. There is no tenderness. There is no rebound and no guarding.  Musculoskeletal: Normal range of motion. He exhibits no edema and no tenderness.  Lymphadenopathy:    He has no cervical adenopathy.  Neurological: He is alert and oriented to person, place, and time. He displays normal reflexes. No cranial nerve deficit. He exhibits normal muscle tone. Coordination normal.  Skin: Skin is warm. No rash noted. He is diaphoretic. No erythema. There is pallor.  Psychiatric: He has a normal mood and affect. His behavior is normal. Judgment and thought content normal.    ED Course  Procedures (including critical care time) Labs Review Labs Reviewed  CBC WITH DIFFERENTIAL - Abnormal; Notable for the following:    WBC 18.1 (*)    RBC 3.19 (*)    Hemoglobin 8.8 (*)    HCT 28.3 (*)    Neutrophils Relative % 86 (*)    Neutro Abs 15.6 (*)    Lymphocytes Relative 6 (*)    Monocytes Absolute 1.4 (*)    All other components within normal limits  COMPREHENSIVE METABOLIC PANEL - Abnormal; Notable for the following:    CO2 13 (*)    Glucose, Bld 184 (*)    BUN 72 (*)    Creatinine, Ser 3.34 (*)    GFR calc non Af Amer 15 (*)    GFR calc Af Amer 18 (*)    Anion gap 19 (*)    All other components within normal limits  TROPONIN I - Abnormal; Notable for the following:    Troponin I 0.72 (*)    All other  components within normal limits  URINALYSIS, ROUTINE W REFLEX MICROSCOPIC - Abnormal; Notable for the following:    APPearance CLOUDY (*)    Hgb urine dipstick SMALL (*)    Protein, ur 100 (*)    All other components within normal limits  PRO B NATRIURETIC PEPTIDE - Abnormal; Notable for the following:    Pro B Natriuretic peptide (BNP) 34562.0 (*)    All other components within normal limits  PROTIME-INR - Abnormal; Notable for the following:    Prothrombin Time 15.6 (*)    All other components within normal limits  URINE MICROSCOPIC-ADD ON - Abnormal; Notable for the following:    Squamous Epithelial / LPF FEW (*)    Bacteria, UA FEW (*)    Casts HYALINE CASTS (*)    All other components within normal limits  I-STAT CG4 LACTIC ACID, ED - Abnormal; Notable for the following:    Lactic Acid, Venous 3.05 (*)    All other components within normal limits  I-STAT ARTERIAL  BLOOD GAS, ED - Abnormal; Notable for the following:    pH, Arterial 7.328 (*)    pCO2 arterial 29.6 (*)    pO2, Arterial 71.0 (*)    Bicarbonate 15.5 (*)    Acid-base deficit 9.0 (*)    All other components within normal limits  POC OCCULT BLOOD, ED - Abnormal; Notable for the following:    Fecal Occult Bld POSITIVE (*)    All other components within normal limits  MRSA PCR SCREENING  HEPARIN LEVEL (UNFRACTIONATED)  CBC  POC OCCULT BLOOD, ED    Imaging Review Dg Chest Portable 1 View  07/19/2014   CLINICAL DATA:  CPR  EXAM: PORTABLE CHEST - 1 VIEW  COMPARISON:  None.  FINDINGS: Bibasilar atelectasis with mild hypoventilation. Negative for edema. Question small bilateral pleural effusions.  IMPRESSION: Mild bibasilar atelectasis and small pleural effusions. Negative for edema.   Electronically Signed   By: Marlan Palauharles  Clark M.D.   On: 07/19/2014 22:31     EKG Interpretation   Date/Time:  Monday July 01 2014 21:26:27 EDT Ventricular Rate:  88 PR Interval:    QRS Duration: 135 QT Interval:  389 QTC  Calculation: 471 R Axis:   -66 Text Interpretation:  Age not entered, assumed to be  80 years old for  purpose of ECG interpretation Atrial fibrillation IVCD, consider atypical  RBBB Inferior infarct, old Probable anterior infarct, age indeterminate  Baseline wander in lead(s) V3 No old tracing to compare Confirmed by  GOLDSTON  MD, SCOTT ([id number]) on 07/17/2014 9:30:52 PM      MDM   Final diagnoses:  Hypotension, unspecified hypotension type  Sudden cardiac arrest  Cardiogenic shock  Hypoxia   On arrival patient noted to be hypotensive, skin is pale, cool, and clammy. Patient is now mentating; however, pulses are weak, and he has decreased oxygen saturation. On bedside limited echocardiography, patient has a non-collapsible IVC, and reduced EF. The left ventricle appears mildly dilated. Bedside ultrasound finding supportive of cardiogenic shock. Patient's initial cardiac arrest episode, possibly the result of arrhythmia or cardiogenic shock. Would be concerned for ACS as well especially based on patient's history as a precipitant for his arrest. EKG strips obtained by EMS show prolonged PR interval, and patterns possibly consistent with heart block. Patient appears to be in rate controled atrial fibrillation at this time, with a right bundle branch pattern.  Frequent PVCs.  We will obtain screening workup for causes of hypotension, but based on presentation and patient's cardiac history, his hypotension is most likely cardiac in origin. He did not have any preceding illness that is more supportive of septic shock. Based on patient's echo findings will withhold additional fluids at this time.  Workup shows a mild lactic acidosis, increased creatinine and BUN, increased anion gap, significantly elevated pro BNP, and troponin elevation to 0.72, most likely secondary to type II NSTEMI. Chest x-ray shows bilateral pleural effusions.  Hemoglobin is reduced at 8.8 and point-of-care Hemoccult stool is  positive.  Patient was discussed with his primary cardiologist Dr. Jacinto HalimGanji.  He would like the patient to be started on low-dose peripheral dopamine.  We will plan on ACS rule out with further workup of patient's rhythmic disturbances. He believes the patient most likely had primary arrhythmic episode leading to his cardiac arrest, and states the patient has a known type I heart block, which possibly progressed to a higher grade block or temporary AV dissociation. Patient will be started on heparin, pharmacy dose protocol with consideration for  his positive Hemoccult. He has received aspirin by EMS today.  Patient admitted for further management.  Patient care was discussed with my attending, Dr. Criss Alvine.    Gavin Pound, MD 07/02/14 [phone]

## 2014-07-02 ENCOUNTER — Inpatient Hospital Stay (HOSPITAL_COMMUNITY): Payer: Medicare Other

## 2014-07-02 DIAGNOSIS — I959 Hypotension, unspecified: Secondary | ICD-10-CM

## 2014-07-02 DIAGNOSIS — E875 Hyperkalemia: Secondary | ICD-10-CM | POA: Diagnosis not present

## 2014-07-02 DIAGNOSIS — R570 Cardiogenic shock: Secondary | ICD-10-CM | POA: Diagnosis present

## 2014-07-02 DIAGNOSIS — J9600 Acute respiratory failure, unspecified whether with hypoxia or hypercapnia: Secondary | ICD-10-CM | POA: Diagnosis present

## 2014-07-02 DIAGNOSIS — J9601 Acute respiratory failure with hypoxia: Secondary | ICD-10-CM | POA: Diagnosis present

## 2014-07-02 DIAGNOSIS — Z66 Do not resuscitate: Secondary | ICD-10-CM | POA: Diagnosis present

## 2014-07-02 DIAGNOSIS — Z7902 Long term (current) use of antithrombotics/antiplatelets: Secondary | ICD-10-CM | POA: Diagnosis not present

## 2014-07-02 DIAGNOSIS — I059 Rheumatic mitral valve disease, unspecified: Secondary | ICD-10-CM | POA: Diagnosis present

## 2014-07-02 DIAGNOSIS — N189 Chronic kidney disease, unspecified: Secondary | ICD-10-CM

## 2014-07-02 DIAGNOSIS — Z87891 Personal history of nicotine dependence: Secondary | ICD-10-CM | POA: Diagnosis not present

## 2014-07-02 DIAGNOSIS — I2510 Atherosclerotic heart disease of native coronary artery without angina pectoris: Secondary | ICD-10-CM | POA: Diagnosis present

## 2014-07-02 DIAGNOSIS — Z515 Encounter for palliative care: Secondary | ICD-10-CM | POA: Diagnosis not present

## 2014-07-02 DIAGNOSIS — E8809 Other disorders of plasma-protein metabolism, not elsewhere classified: Secondary | ICD-10-CM | POA: Diagnosis present

## 2014-07-02 DIAGNOSIS — I509 Heart failure, unspecified: Secondary | ICD-10-CM | POA: Diagnosis present

## 2014-07-02 DIAGNOSIS — J811 Chronic pulmonary edema: Secondary | ICD-10-CM | POA: Diagnosis present

## 2014-07-02 DIAGNOSIS — N179 Acute kidney failure, unspecified: Secondary | ICD-10-CM | POA: Diagnosis present

## 2014-07-02 DIAGNOSIS — I2589 Other forms of chronic ischemic heart disease: Secondary | ICD-10-CM | POA: Diagnosis present

## 2014-07-02 DIAGNOSIS — J441 Chronic obstructive pulmonary disease with (acute) exacerbation: Secondary | ICD-10-CM | POA: Diagnosis present

## 2014-07-02 DIAGNOSIS — I129 Hypertensive chronic kidney disease with stage 1 through stage 4 chronic kidney disease, or unspecified chronic kidney disease: Secondary | ICD-10-CM | POA: Diagnosis present

## 2014-07-02 DIAGNOSIS — I4729 Other ventricular tachycardia: Secondary | ICD-10-CM | POA: Diagnosis not present

## 2014-07-02 DIAGNOSIS — N184 Chronic kidney disease, stage 4 (severe): Secondary | ICD-10-CM | POA: Diagnosis present

## 2014-07-02 DIAGNOSIS — R55 Syncope and collapse: Secondary | ICD-10-CM | POA: Diagnosis present

## 2014-07-02 DIAGNOSIS — E872 Acidosis, unspecified: Secondary | ICD-10-CM | POA: Diagnosis present

## 2014-07-02 DIAGNOSIS — Z9861 Coronary angioplasty status: Secondary | ICD-10-CM | POA: Diagnosis not present

## 2014-07-02 DIAGNOSIS — D72829 Elevated white blood cell count, unspecified: Secondary | ICD-10-CM | POA: Diagnosis present

## 2014-07-02 DIAGNOSIS — K922 Gastrointestinal hemorrhage, unspecified: Secondary | ICD-10-CM | POA: Diagnosis present

## 2014-07-02 DIAGNOSIS — I252 Old myocardial infarction: Secondary | ICD-10-CM | POA: Diagnosis not present

## 2014-07-02 DIAGNOSIS — I472 Ventricular tachycardia: Secondary | ICD-10-CM | POA: Diagnosis not present

## 2014-07-02 DIAGNOSIS — D500 Iron deficiency anemia secondary to blood loss (chronic): Secondary | ICD-10-CM | POA: Diagnosis present

## 2014-07-02 DIAGNOSIS — I4891 Unspecified atrial fibrillation: Secondary | ICD-10-CM | POA: Diagnosis present

## 2014-07-02 DIAGNOSIS — R4189 Other symptoms and signs involving cognitive functions and awareness: Secondary | ICD-10-CM | POA: Diagnosis present

## 2014-07-02 DIAGNOSIS — Z79899 Other long term (current) drug therapy: Secondary | ICD-10-CM | POA: Diagnosis not present

## 2014-07-02 DIAGNOSIS — I214 Non-ST elevation (NSTEMI) myocardial infarction: Secondary | ICD-10-CM

## 2014-07-02 DIAGNOSIS — E785 Hyperlipidemia, unspecified: Secondary | ICD-10-CM | POA: Diagnosis present

## 2014-07-02 LAB — CBC
HEMATOCRIT: 28.1 % — AB (ref 39.0–52.0)
Hemoglobin: 8.7 g/dL — ABNORMAL LOW (ref 13.0–17.0)
MCH: 28 pg (ref 26.0–34.0)
MCHC: 31 g/dL (ref 30.0–36.0)
MCV: 90.4 fL (ref 78.0–100.0)
Platelets: 333 10*3/uL (ref 150–400)
RBC: 3.11 MIL/uL — AB (ref 4.22–5.81)
RDW: 14.9 % (ref 11.5–15.5)
WBC: 10.5 10*3/uL (ref 4.0–10.5)

## 2014-07-02 LAB — PROTEIN / CREATININE RATIO, URINE
Creatinine, Urine: 32.81 mg/dL
Protein Creatinine Ratio: 0.15 (ref 0.00–0.15)
Total Protein, Urine: 4.9 mg/dL

## 2014-07-02 LAB — COMPREHENSIVE METABOLIC PANEL
ALBUMIN: 3.5 g/dL (ref 3.5–5.2)
ALK PHOS: 69 U/L (ref 39–117)
ALT: 33 U/L (ref 0–53)
AST: 23 U/L (ref 0–37)
Anion gap: 16 — ABNORMAL HIGH (ref 5–15)
BUN: 76 mg/dL — ABNORMAL HIGH (ref 6–23)
CALCIUM: 8.5 mg/dL (ref 8.4–10.5)
CO2: 17 mEq/L — ABNORMAL LOW (ref 19–32)
Chloride: 113 mEq/L — ABNORMAL HIGH (ref 96–112)
Creatinine, Ser: 3.63 mg/dL — ABNORMAL HIGH (ref 0.50–1.35)
GFR calc non Af Amer: 14 mL/min — ABNORMAL LOW (ref >90)
GFR, EST AFRICAN AMERICAN: 16 mL/min — AB (ref >90)
Glucose, Bld: 103 mg/dL — ABNORMAL HIGH (ref 70–99)
Potassium: 5.5 mEq/L — ABNORMAL HIGH (ref 3.7–5.3)
SODIUM: 146 meq/L (ref 137–147)
Total Bilirubin: 0.4 mg/dL (ref 0.3–1.2)
Total Protein: 6.7 g/dL (ref 6.0–8.3)

## 2014-07-02 LAB — TROPONIN I
TROPONIN I: 0.65 ng/mL — AB (ref <0.30)
TROPONIN I: 0.76 ng/mL — AB (ref <0.30)
Troponin I: 0.8 ng/mL (ref <0.30)

## 2014-07-02 LAB — PROTIME-INR
INR: 1.24 (ref 0.00–1.49)
Prothrombin Time: 15.6 seconds — ABNORMAL HIGH (ref 11.6–15.2)

## 2014-07-02 LAB — IRON AND TIBC
Iron: 20 ug/dL — ABNORMAL LOW (ref 42–135)
Saturation Ratios: 5 % — ABNORMAL LOW (ref 20–55)
TIBC: 404 ug/dL (ref 215–435)
UIBC: 384 ug/dL (ref 125–400)

## 2014-07-02 LAB — TSH: TSH: 1.39 u[IU]/mL (ref 0.350–4.500)

## 2014-07-02 LAB — MRSA PCR SCREENING: MRSA BY PCR: NEGATIVE

## 2014-07-02 LAB — HEPARIN LEVEL (UNFRACTIONATED)
HEPARIN UNFRACTIONATED: 0.68 [IU]/mL (ref 0.30–0.70)
Heparin Unfractionated: 0.79 IU/mL — ABNORMAL HIGH (ref 0.30–0.70)

## 2014-07-02 LAB — FERRITIN: Ferritin: 20 ng/mL — ABNORMAL LOW (ref 22–322)

## 2014-07-02 MED ORDER — ASPIRIN 300 MG RE SUPP: 300.0000 mg | RECTAL | Status: AC

## 2014-07-02 MED ORDER — ACETAMINOPHEN 325 MG PO TABS: 650.0000 mg | ORAL_TABLET | ORAL | Status: DC | PRN

## 2014-07-02 MED ORDER — CETYLPYRIDINIUM CHLORIDE 0.05 % MT LIQD
7.0000 mL | Freq: Two times a day (BID) | OROMUCOSAL | Status: DC
  Administered 2014-07-02 – 2014-07-03 (×5): 7 mL via OROMUCOSAL

## 2014-07-02 MED ORDER — ASPIRIN 81 MG PO CHEW: 324.0000 mg | CHEWABLE_TABLET | ORAL | Status: AC

## 2014-07-02 MED ORDER — TECHNETIUM TC 99M DIETHYLENETRIAME-PENTAACETIC ACID: 40.0000 | Freq: Once | INTRAVENOUS | Status: AC | PRN

## 2014-07-02 MED ORDER — TECHNETIUM TO 99M ALBUMIN AGGREGATED
6.0000 | Freq: Once | INTRAVENOUS | Status: AC | PRN
  Administered 2014-07-02: 6 via INTRAVENOUS

## 2014-07-02 MED ORDER — IPRATROPIUM-ALBUTEROL 0.5-2.5 (3) MG/3ML IN SOLN
3.0000 mL | RESPIRATORY_TRACT | Status: DC
  Administered 2014-07-02 (×3): 3 mL via RESPIRATORY_TRACT
  Filled 2014-07-02 (×3): qty 3

## 2014-07-02 MED ORDER — DOPAMINE-DEXTROSE 3.2-5 MG/ML-% IV SOLN
3.0000 ug/kg/min | Freq: Once | INTRAVENOUS | Status: AC
  Administered 2014-07-02: 3 ug/kg/min via INTRAVENOUS

## 2014-07-02 MED ORDER — IPRATROPIUM-ALBUTEROL 0.5-2.5 (3) MG/3ML IN SOLN: 3.0000 mL | Freq: Four times a day (QID) | RESPIRATORY_TRACT | Status: DC

## 2014-07-02 MED ORDER — NITROGLYCERIN 0.4 MG SL SUBL: 0.4000 mg | SUBLINGUAL_TABLET | SUBLINGUAL | Status: DC | PRN

## 2014-07-02 MED ORDER — PANTOPRAZOLE SODIUM 40 MG PO TBEC
40.0000 mg | DELAYED_RELEASE_TABLET | Freq: Every day | ORAL | Status: DC
  Administered 2014-07-02 – 2014-07-03 (×2): 40 mg via ORAL
  Filled 2014-07-02 (×2): qty 1

## 2014-07-02 MED ORDER — METHYLPREDNISOLONE SODIUM SUCC 125 MG IJ SOLR
60.0000 mg | Freq: Four times a day (QID) | INTRAMUSCULAR | Status: DC
Start: 2014-07-02 — End: 2014-07-04
  Administered 2014-07-02 – 2014-07-04 (×8): 60 mg via INTRAVENOUS
  Filled 2014-07-02 (×5): qty 0.96
  Filled 2014-07-02: qty 2
  Filled 2014-07-02 (×3): qty 0.96
  Filled 2014-07-02: qty 2
  Filled 2014-07-02 (×4): qty 0.96

## 2014-07-02 MED ORDER — SODIUM POLYSTYRENE SULFONATE 15 GM/60ML PO SUSP
30.0000 g | Freq: Once | ORAL | Status: AC
  Administered 2014-07-02: 30 g via ORAL
  Filled 2014-07-02: qty 120

## 2014-07-02 MED ORDER — HEPARIN (PORCINE) IN NACL 100-0.45 UNIT/ML-% IJ SOLN
1150.0000 [IU]/h | INTRAMUSCULAR | Status: DC
  Administered 2014-07-02: 950 [IU]/h via INTRAVENOUS
  Administered 2014-07-02: 1100 [IU]/h via INTRAVENOUS
  Administered 2014-07-03: 800 [IU]/h via INTRAVENOUS
  Filled 2014-07-02 (×4): qty 250

## 2014-07-02 MED ORDER — PIPERACILLIN-TAZOBACTAM IN DEX 2-0.25 GM/50ML IV SOLN
2.2500 g | Freq: Three times a day (TID) | INTRAVENOUS | Status: DC
  Administered 2014-07-02 – 2014-07-04 (×6): 2.25 g via INTRAVENOUS
  Filled 2014-07-02 (×10): qty 50

## 2014-07-02 MED ORDER — ONDANSETRON HCL 4 MG/2ML IJ SOLN: 4.0000 mg | Freq: Four times a day (QID) | INTRAMUSCULAR | Status: DC | PRN

## 2014-07-02 MED ORDER — ASPIRIN EC 81 MG PO TBEC
81.0000 mg | DELAYED_RELEASE_TABLET | Freq: Every day | ORAL | Status: DC
  Administered 2014-07-03: 81 mg via ORAL
  Filled 2014-07-02 (×2): qty 1

## 2014-07-02 MED ORDER — FUROSEMIDE 10 MG/ML IJ SOLN
160.0000 mg | Freq: Three times a day (TID) | INTRAVENOUS | Status: DC
  Administered 2014-07-02 – 2014-07-03 (×3): 160 mg via INTRAVENOUS
  Filled 2014-07-02 (×6): qty 16

## 2014-07-02 NOTE — Consult Note (Addendum)
[facility] Medical Consultation  Arbie CookeyJames Marvin Theiler HQI:[id number]:[id number] DOB: [birth year] DOA: 07/26/2014 PCP: Pearson GrippeKIM, Osmar, MD   Requesting physician: Dr Jacinto HalimGanji Date of consultation: 07/02/2014 Reason for consultation: Acute Renal Failure  Impression/Recommendations Principal Problem:   Unresponsiveness Active Problems:   NSTEMI (non-ST elevated myocardial infarction)   Syncope   Acute respiratory failure with hypoxia   COPD exacerbation   Leukocytosis, unspecified   Atrial fibrillation   Acute on chronic renal failure   Coronary atherosclerosis of native coronary artery    1. Possible Cardiogenic Shock. Patient who was found unresponsive, was given CPR by family members at home. May have been as a consequence of NSTEMI. He presented hypotensive, bradycardic, hypoxemic with Lactic Acid level of 3.05, elevated troponins, BNP of 34,562 in acute on chronic renal failure. Patient started on a Dopamine and Heparin infusion. Cardiology following. Will obtain a transthoracic echocardiogram, follow him closely in the step down unit.  2. Acute on Chronic Renal Failure. Has baseline Creatinine of 2.3. Initial labs showing a Creatinine of 3.34 trending up to 3.63. This could be secondary to ATN from hypotension/decreased perfusion. Labs showing a BNP of 34,562. Nephrology consulted. Will follow kidney function, obtain a renal U/S.   3. Possible Aspiration. Patient presenting hypoxemic, was given chest compressions a home. Initial CXR not showing consolidation or infiltrate. Will cover with Zosyn, repeat CXR in am and reassess need for antimicrobial therapy.  4. Acute Hypoxemic Respiratory failure. Evidenced by an O2 sat of 66% on presentation, requiring a venturi mask, no infiltrate on CXR. On exam has diminished breath sounds with expiratory wheezes and crackles. Likely multifactorial with the possibility of aspiration, CHF and COPD contributing. Will check a V/Q scan to assess for  PE. Meanwhile he  remains anticoagulated with IV Heparin. Will start emperic antimicrobial therapy with Zosyn, provide Nebs and IV steroids.Follow up on am CXR. 5. Atrial Fibrillation. Initial EKG showing Afib which appears to be new, cardiology following. Will follow up on 2D echo. Currently rate controlled, is anticoagulated.  6. Possible COPD Exacerbation. Patient having wheezes and diminished breath sounds on exam. Will cover with IV steroids and nebs.  7.  Acute on Chronic Anemia. Patient having a Hg of 10.3 on 04/10/2014, with labs now showing Hg of 8.7. Possibly may have had dark stools 2 weeks ago. Will likely need GI workup once stabilized. Continue to follow H&H closely as he is on IV Heparin.   I will followup again tomorrow. Please contact me if I can be of assistance in the meanwhile. Thank you for this consultation.  Chief Complaint: Unresponsiveness  HPI: Patient is a pleasant 80 year old with an established history of coronary artery disease, inferior myocardial infarction in remote past, hypertension, dyslipidemia, who was initially admitted to the cardiology service on 07/25/2014. patient reported falling from his bed, does not recall symptoms prior to event. He was found by family members unresponsive and started CPR immediately. EMS was called, on arrival found patient responsive, though unable to obtain pulse. He was immediately bagged and given IV fluids. [facility]  he was found to be hypotensive with systolic blood pressures in the 80s and bradycardic with heart rates in the 20s to 30s. Patient was also found to be hypoxemic on presentation having an O2 sat of 66%.  He was able to provide history in the emergency room. He reports having several episodes of syncope over the past week as well as having exertional dyspnea and chest  pressure. Initial lab work revealed a creatinine of 3.34 with BUN of 72. BNP elevated at 34,562 with troponin of 0.72. Chest  x-ray showed mild bibasilar atelectasis, negative for edema or consolidation. He was started on a dopamine drip and heparin drip by cardiology, and admitted to the step down unit. Cardiology has requested transfer of care to the medicine service with cardiology following as consultant.                                                                                                                                                                                                                                                                                                                                                               Review of Systems:  Constitutional:  No weight loss, night sweats, Fevers, chills, fatigue.  HEENT:  No headaches, Difficulty swallowing,Tooth/dental problems,Sore throat,  No sneezing, itching, ear ache, nasal congestion, post nasal drip,  Cardio-vascular:  Positive for chest pain, recurrent syncope, denies Orthopnea, PND, swelling in lower extremities, anasarca, dizziness, palpitations  GI:  No heartburn, indigestion, abdominal pain, nausea, vomiting, diarrhea, change in bowel habits, loss of appetite  Resp:  Positive for shortness of breath with exertion or at rest. No excess mucus, no productive cough, No non-productive cough, No coughing up of blood.No change in color of mucus.No wheezing.No chest wall deformity  Skin:  no rash or lesions.  GU:  no dysuria, change in color of urine, no urgency or frequency. No flank pain.  Musculoskeletal:  No joint pain or swelling. No decreased range of motion. No back pain.  Psych:  No change in mood or affect. No depression or anxiety. No memory loss.    Past Medical History  Diagnosis Date  . Coronary artery disease   . Hypertension   .  Hypercholesterolemia    Past Surgical History  Procedure Laterality Date  . Cardiac surgery     Social History:  reports that he has quit smoking. He does not have any smokeless  tobacco history on file. He reports that he does not use illicit drugs. His alcohol history is not on file.  No Known Allergies History reviewed. No pertinent family history.  Prior to Admission medications   Medication Sig Start Date End Date Taking? Authorizing Provider  amLODipine (NORVASC) 2.5 MG tablet Take 2.5 mg by mouth every morning.   Yes Historical Provider, MD  clopidogrel (PLAVIX) 75 MG tablet Take 75 mg by mouth every morning.   Yes Historical Provider, MD  gabapentin (NEURONTIN) 100 MG capsule Take 100 mg by mouth 3 (three) times daily.   Yes Historical Provider, MD  latanoprost (XALATAN) 0.005 % ophthalmic solution Place 1 drop into both eyes at bedtime.   Yes Historical Provider, MD  levothyroxine (SYNTHROID, LEVOTHROID) 50 MCG tablet Take 50 mcg by mouth daily before breakfast.   Yes Historical Provider, MD  rosuvastatin (CRESTOR) 10 MG tablet Take 10 mg by mouth every evening.   Yes Historical Provider, MD  valsartan (DIOVAN) 160 MG tablet Take 160 mg by mouth every morning.   Yes Historical Provider, MD  Vitamin D, Ergocalciferol, (DRISDOL) 50000 UNITS CAPS capsule Take 50,000 Units by mouth every Friday.    Yes Historical Provider, MD   Physical Exam: Blood pressure 100/65, pulse 90, temperature 98.4 F (36.9 C), temperature source Oral, resp. rate 23, height 6\' 5"  (1.956 m), weight 78.3 kg (172 lb 9.9 oz), SpO2 100.00%. Filed Vitals:   07/02/14 0813  BP: 100/65  Pulse: 90  Temp: 98.4 F (36.9 C)  Resp: 23     General:  Ill appearing, dyspneic, is awake and alert and  Is following commands  Eyes: Pupils are equal round and reactive to light, extraocular movement intact, no sclera icterus  Neck: Did not appreciate jugular venous distention, neck otherwise symmetrical, trachea midline, no thyromegaly  Cardiovascular: Normal S1-S2, no murmurs rubs or gallops, trace edema to lower extremities bilaterally  Respiratory: Diminished breath sounds bilaterally, has  bilateral expiratory wheezes, tachypnea, bibasilar crackles, no rhonchi or rales evident  Abdomen: Soft nontender nondistended positive bowel sounds  Skin: No rashes or lesions  Musculoskeletal: Preserved range of motion  Psychiatric: Patient is awake, alert, oriented, answers questions appropriately  Neurologic: Cranial nerves II through XII grossly intact, no alteration to sensation global 5 out of 5 muscle strength  Labs on Admission:  Basic Metabolic Panel:  Recent Labs Lab 07/25/2014 2140 07/02/14 0915  NA 143 146  K 5.2 5.5*  CL 111 113*  CO2 13* 17*  GLUCOSE 184* 103*  BUN 72* 76*  CREATININE 3.34* 3.63*  CALCIUM 8.4 8.5   Liver Function Tests:  Recent Labs Lab 07/28/2014 2140 07/02/14 0915  AST 37 23  ALT 39 33  ALKPHOS 73 69  BILITOT 0.4 0.4  PROT 7.1 6.7  ALBUMIN 3.6 3.5   No results found for this basename: LIPASE, AMYLASE,  in the last 168 hours No results found for this basename: AMMONIA,  in the last 168 hours CBC:  Recent Labs Lab 07/23/2014 2140 07/02/14 0915  WBC 18.1* 10.5  NEUTROABS 15.6*  --   HGB 8.8* 8.7*  HCT 28.3* 28.1*  MCV 88.7 90.4  PLT 334 333   Cardiac Enzymes:  Recent Labs Lab 07/21/2014 2140 07/02/14 0915  TROPONINI 0.72* 0.80*   BNP: No  components found with this basename: POCBNP,  CBG: No results found for this basename: GLUCAP,  in the last 168 hours  Radiological Exams on Admission: Dg Chest Port 1 View  07/02/2014   CLINICAL DATA:  Short of breath  EXAM: PORTABLE CHEST - 1 VIEW  COMPARISON:  Jul 16, 2014  FINDINGS: There is vascular congestion, greater on the right, and mild hazy right perihilar airspace opacity. Mild cardiomegaly is stable. Mild hazy basilar opacity is likely due to a combination of atelectasis and small effusions. No pneumothorax.  IMPRESSION: 1. Allowing for differences in patient positioning and technique, there has been no substantial change from the prior study. Findings suggest mild asymmetric  edema. 2. Mild stable basilar atelectasis and probable small effusions.   Electronically Signed   By: Amie Portland M.D.   On: 07/02/2014 08:45   Dg Chest Portable 1 View  07/16/14   CLINICAL DATA:  CPR  EXAM: PORTABLE CHEST - 1 VIEW  COMPARISON:  None.  FINDINGS: Bibasilar atelectasis with mild hypoventilation. Negative for edema. Question small bilateral pleural effusions.  IMPRESSION: Mild bibasilar atelectasis and small pleural effusions. Negative for edema.   Electronically Signed   By: Marlan Palau M.D.   On: 07/16/14 22:31    EKG: Independently reviewed. EKG showing atrial fibrillation with ventricular rates in the 80s  Time spent: 80 min  Jeralyn Bennett [facility] Pager [phone]  If 7PM-7AM, please contact night-coverage [email or web address] Password [email or web address] 07/02/2014, 11:03 AM

## 2014-07-02 NOTE — Progress Notes (Signed)
ANTICOAGULATION CONSULT NOTE - Follow Up Consult  Pharmacy Consult for heparin Indication: atrial fibrillation  No Known Allergies  Patient Measurements: Height: 6\' 5"  (195.6 cm) Weight: 172 lb 9.9 oz (78.3 kg) IBW/kg (Calculated) : 89.1  Vital Signs: Temp: 97.6 F (36.4 C) (08/04 1607) Temp src: Oral (08/04 1607) BP: 105/70 mmHg (08/04 1607) Pulse Rate: 95 (08/04 1607)  Labs:  Recent Labs  07/14/2014 2140 07/02/14 0113 07/02/14 0915 07/02/14 1405 07/02/14 1905  HGB 8.8*  --  8.7*  --   --   HCT 28.3*  --  28.1*  --   --   PLT 334  --  333  --   --   LABPROT  --  15.6*  --   --   --   INR  --  1.24  --   --   --   HEPARINUNFRC  --   --  0.68  --  0.79*  CREATININE 3.34*  --  3.63*  --   --   TROPONINI 0.72*  --  0.80* 0.65*  --     Estimated Creatinine Clearance: 16.5 ml/min (by C-G formula based on Cr of 3.63).   Medications:  Infusions:  . heparin 950 Units/hr (07/02/14 1018)    Assessment: 80 y/o male found unresponsive by family after 2 day history of syncopal episodes. Pharmacy managing IV heparin for new onset Afib. Of note, patient has had history of dark stools and GI has been consulted per cardiology note. Heparin drip was decreased earlier today and the heparin level is unexpectedly higher.  Goal of Therapy:  Heparin level 0.3-0.5 units/ml Monitor platelets by anticoagulation protocol: Yes   Plan:  - Decrease heparin drip to 800 units/hr - Daily heparin level and CBC - Monitor for s/sx of bleeding  Celedonio MiyamotoJeremy Kendry Pfarr, PharmD, BCPS Clinical Pharmacist Pager [phone]   07/02/2014 7:37 PM

## 2014-07-02 NOTE — Consult Note (Signed)
Glenn Meza is an 80 y.o. 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  He was given CPR and was reportedly bradycardic.  Now in A fib with controlled VR.  Hx of CKD for "last 2 months" according to pt.  No Hx gross hematuria.  Hx of multiple stones in the past but none in "42yrs".  No recent NSAID's.  SBP in the 90's in ER and pt on diovan.  Hx HTN for over 55yrs. Labs from his PCP show Scr 7/14 of 2.2, 1/15 of 2.1, 5/15 of 2.4, 6/15 of 2.3.  Past Medical History  Diagnosis Date  . Coronary artery disease   . Hypertension   . Hypercholesterolemia     Past Surgical History  Procedure Laterality Date  . Cardiac surgery      History reviewed. No pertinent family history. FH neg for renal disease Social History:  reports that he has quit smoking. He does not have any smokeless tobacco history on file. He reports that he does not use illicit drugs. His alcohol history is not on file.  Son lives with him.  Ex smoker, quit 7-28yrs ago  Allergies: No Known Allergies  Medications Prior to Admission  Medication Sig Dispense Refill  . amLODipine (NORVASC) 2.5 MG tablet Take 2.5 mg by mouth every morning.      . clopidogrel (PLAVIX) 75 MG tablet Take 75 mg by mouth every morning.      . gabapentin (NEURONTIN) 100 MG capsule Take 100 mg by mouth 3 (three) times daily.      Marland Kitchen latanoprost (XALATAN) 0.005 % ophthalmic solution Place 1 drop into both eyes at bedtime.      Marland Kitchen levothyroxine (SYNTHROID, LEVOTHROID) 50 MCG tablet Take 50 mcg by mouth daily before breakfast.      . rosuvastatin (CRESTOR) 10 MG tablet Take 10 mg by mouth every evening.      . valsartan (DIOVAN) 160 MG tablet Take 160 mg by mouth every morning.      . Vitamin D, Ergocalciferol, (DRISDOL) 50000 UNITS CAPS capsule Take 50,000 Units by mouth every Friday.          Lab Results: UA: 100mg  protein otherwise  benign   Recent Labs  06/30/2014 2140 07/02/14 0915  WBC 18.1* 10.5  HGB 8.8* 8.7*  HCT 28.3* 28.1*  PLT 334 333   BMET  Recent Labs  07/23/2014 2140 07/02/14 0915  NA 143 146  K 5.2 5.5*  CL 111 113*  CO2 13* 17*  GLUCOSE 184* 103*  BUN 72* 76*  CREATININE 3.34* 3.63*  CALCIUM 8.4 8.5   LFT  Recent Labs  07/02/14 0915  PROT 6.7  ALBUMIN 3.5  AST 23  ALT 33  ALKPHOS 69  BILITOT 0.4   Nm Pulmonary Perf And Vent  07/02/2014   CLINICAL DATA:  Respiratory distress  EXAM: NUCLEAR MEDICINE VENTILATION - PERFUSION LUNG SCAN  TECHNIQUE: Ventilation images were obtained in multiple projections using inhaled aerosol technetium 99 M DTPA. Perfusion images were obtained in multiple projections after intravenous injection of Tc-24m MAA.  RADIOPHARMACEUTICALS:  40.0 mCi Tc-86m DTPA aerosol and 6.0 mCi Tc-68m MAA  COMPARISON:  Current chest radiograph  FINDINGS: Ventilation: There is heterogeneous accumulation of the aerosol radiotracer within the lungs. Relative decreased radiotracer accumulation is noted in the upper lobes.  Perfusion: There are no wedge-shaped perfusion defects. There is decreased radiotracer  accumulation adjacent to the right oblique fissure and minor fissure. Left lung perfusion is normal.  IMPRESSION: 1. Very low probability pulmonary ventilation-perfusion study for pulmonary thromboembolism.   Electronically Signed   By: Amie Portlandavid  Ormond M.D.   On: 07/02/2014 13:51   Dg Chest Port 1 View  07/02/2014   CLINICAL DATA:  Short of breath  EXAM: PORTABLE CHEST - 1 VIEW  COMPARISON:  07/03/2014  FINDINGS: There is vascular congestion, greater on the right, and mild hazy right perihilar airspace opacity. Mild cardiomegaly is stable. Mild hazy basilar opacity is likely due to a combination of atelectasis and small effusions. No pneumothorax.  IMPRESSION: 1. Allowing for differences in patient positioning and technique, there has been no substantial change from the prior study.  Findings suggest mild asymmetric edema. 2. Mild stable basilar atelectasis and probable small effusions.   Electronically Signed   By: Amie Portlandavid  Ormond M.D.   On: 07/02/2014 08:45   Dg Chest Portable 1 View  07/07/2014   CLINICAL DATA:  CPR  EXAM: PORTABLE CHEST - 1 VIEW  COMPARISON:  None.  FINDINGS: Bibasilar atelectasis with mild hypoventilation. Negative for edema. Question small bilateral pleural effusions.  IMPRESSION: Mild bibasilar atelectasis and small pleural effusions. Negative for edema.   Electronically Signed   By: Marlan Palauharles  Clark M.D.   On: 07/15/2014 22:31    ROS: No change in vision No CP + SOB No ABD pain No dysuria Numbness of feet No new arthritic CO  PHYSICAL EXAM: Blood pressure 107/71, pulse 91, temperature 98 F (36.7 C), temperature source Oral, resp. rate 22, height 6\' 5"  (1.956 m), weight 78.3 kg (172 lb 9.9 oz), SpO2 100.00%. HEENT: PERRLA EOMI NECK:+ JVD LUNGS:Bibasilar crackles and scattered wheezes CARDIAC:irreg, irreg 2/6 systolic M ABD:+ BS NTND No HSM Bladder not palp BMW:UXLKGEXT:trace edema NEURO:CNI OX3 no asterixis  Assessment: 1. Acute on CKD 4 (baseline Scr 2.3-2.5)  Acute most likely due to hypotension on ARB 2. Hx Nephrolithiais 3. Anemia 4. A fib 5. Mild hyperkalemia 6. Mild metabolic acidosis PLAN: 1. Renal US 2. Get info from PCP 3. SPEP, free light chains, iron studies, PO4, PTH 4. Pr/Cr 5. Kayexalate x1 for hyperkalemia 6. Foley cath 7. IV lasix   Glenn Meza 07/02/2014, 2:47 PM

## 2014-07-02 NOTE — Progress Notes (Signed)
ANTICOAGULATION CONSULT NOTE - Initial Consult  Pharmacy Consult for heparin Indication: atrial fibrillation  No Known Allergies  Patient Measurements: Height: 6\' 5"  (195.6 cm) Weight: 170 lb (77.111 kg) IBW/kg (Calculated) : 89.1  Vital Signs: Temp: 98.9 F (37.2 C) (08/03 2229) Temp src: Rectal (08/03 2229) BP: 96/66 mmHg (08/04 0015) Pulse Rate: 28 (08/04 0000)  Labs:  Recent Labs  03-24-14 2140  HGB 8.8*  HCT 28.3*  PLT 334  CREATININE 3.34*  TROPONINI 0.72*    Estimated Creatinine Clearance: 17.6 ml/min (by C-G formula based on Cr of 3.34).   Medical History: Past Medical History  Diagnosis Date  . Coronary artery disease   . Hypertension   . Hypercholesterolemia     Assessment: 80yo male was found unresponsive by family after 2d h/o syncopal episodes, also reports SOB, found to be in Afib, to start heparin with note that pt is guaiac positive.  Goal of Therapy:  Heparin level 0.3-0.5 units/ml Monitor platelets by anticoagulation protocol: Yes   Plan:  Will start heparin gtt without bolus at 1100 units/hr and monitor heparin levels and CBC.  Vernard GamblesVeronda Neftaly Swiss, PharmD, BCPS  07/02/2014,12:33 AM

## 2014-07-02 NOTE — Progress Notes (Signed)
ANTICOAGULATION CONSULT NOTE - Follow Up Consult  Pharmacy Consult for heparin Indication: atrial fibrillation  No Known Allergies  Patient Measurements: Height: 6\' 5"  (195.6 cm) Weight: 172 lb 9.9 oz (78.3 kg) IBW/kg (Calculated) : 89.1  Vital Signs: Temp: 98.4 F (36.9 C) (08/04 0813) Temp src: Oral (08/04 0813) BP: 100/65 mmHg (08/04 0813) Pulse Rate: 90 (08/04 0813)  Labs:  Recent Labs  06/30/2014 2140 07/02/14 0113 07/02/14 0915  HGB 8.8*  --  8.7*  HCT 28.3*  --  28.1*  PLT 334  --  333  LABPROT  --  15.6*  --   INR  --  1.24  --   HEPARINUNFRC  --   --  0.68  CREATININE 3.34*  --   --   TROPONINI 0.72*  --   --     Estimated Creatinine Clearance: 17.9 ml/min (by C-G formula based on Cr of 3.34).   Medications:  Infusions:  . heparin 1,100 Units/hr (07/02/14 0119)    Assessment: 80 y/o male found unresponsive by family after 2 day history of syncopal episodes. Pharmacy managing IV heparin for new onset Afib. Of note, patient has had history of dark stools and GI has been consulted per cardiology note. Heparin is above the lower goal set with guaiac positive stool. No overt bleeding noted, Hb is low but stable, platelets are normal.  Goal of Therapy:  Heparin level 0.3-0.5 units/ml Monitor platelets by anticoagulation protocol: Yes   Plan:  - Decrease heparin drip to 950 units/hr - 8 hr heparin level - Daily heparin level and CBC - Monitor for s/sx of bleeding  [facility]oscopy Centers LLCJennifer Naples, DunlapPharm.D., BCPS Clinical Pharmacist Pager: [phone] 07/02/2014 9:58 AM

## 2014-07-02 NOTE — ED Provider Notes (Signed)
I saw and evaluated the patient, reviewed the resident's note and I agree with the findings and plan.  CRITICAL CARE Performed by: Pricilla LovelessGOLDSTON, Gilbert Manolis T   Total critical care time: 30 minutes  Critical care time was exclusive of separately billable procedures and treating other patients.  Critical care was necessary to treat or prevent imminent or life-threatening deterioration.  Critical care was time spent personally by me on the following activities: development of treatment plan with patient and/or surrogate as well as nursing, discussions with consultants, evaluation of patient's response to treatment, examination of patient, obtaining history from patient or surrogate, ordering and performing treatments and interventions, ordering and review of laboratory studies, ordering and review of radiographic studies, pulse oximetry and re-evaluation of patient's condition.    EKG Interpretation   Date/Time:  Monday July 01 2014 21:26:27 EDT Ventricular Rate:  88 PR Interval:    QRS Duration: 135 QT Interval:  389 QTC Calculation: 471 R Axis:   -66 Text Interpretation:  Age not entered, assumed to be  80 years old for  purpose of ECG interpretation Atrial fibrillation IVCD, consider atypical  RBBB Inferior infarct, old Probable anterior infarct, age indeterminate  Baseline wander in lead(s) V3 No old tracing to compare Confirmed by  Rhea Thrun  MD, Saffron Busey ([id number]) on 07/19/2014 9:30:52 PM Also confirmed by  Criss AlvineGOLDSTON  MD, Marycatherine Maniscalco ([id number]), editor [place]  CCT, BEVERLY ([id number])  on  07/02/2014 6:58:17 AM       Patient comes in status post receiving compressions after syncope. Patient is currently awake and alert but appears quite dyspneic. Shortness of breath improved with non-rebreather oxygen. No current chest pain. Is otherwise asymptomatic. Has mild troponin elevation and abnormal EKG. Discussed with cardiology is recommending heparin. However patient is significantly anemic and has heme positive  stools. Will start on a lower dose protocol after discussion with cardiology. At this point the patient is stable but needs a heart level of care will be admitted to the ICU.  Audree CamelScott T Jhana Giarratano, MD 07/02/14 [phone]

## 2014-07-02 NOTE — Progress Notes (Addendum)
In Nuclear Med with pt - to monitor during lung scan.  Pt alert and oriented and denies pain at present.  Pt in first degree A-V block per floor nurse - this is baseline for him.

## 2014-07-02 NOTE — Progress Notes (Signed)
  Echocardiogram 2D Echocardiogram has been performed.  Margreta JourneyLOMBARDO, Federick Levene 07/02/2014, 9:24 AM

## 2014-07-02 NOTE — Progress Notes (Signed)
06/01/2014 Foley was placed at 1900, he had clear yellow urine and tolerate  Well. [facility] CenterNadine Fayelynn Distel RN.

## 2014-07-02 NOTE — Progress Notes (Signed)
Lung scan completed - tolerated without difficulty - will transport back to unit on telemetry.

## 2014-07-02 NOTE — H&P (Signed)
Glenn Meza is an 80 y.o. male.   Chief Complaint:  Syncope HPI:  [name] is a very pleasant 80 year old fairly active Caucasian male with history of known coronary artery disease and history of probable inferior myocardial infarction in the remote past and moderate mitral regurgitation, I had seen him about a month ago, he had been doing well until 2 weeks ago he stated that he had constipation and had taken Colace, stated that he had dark stools. This lasted for about 3-4 days. Then it subsided spontaneously. He had been doing well until about a week ago he started noticing that he would have syncopal spells. No preceding events, denied any chest pain or shortness of breath. Over the weekend continued to 3-4 episodes, yesterday his family realize that he wasn't doing well and he had an episode of syncope while air, EMS was called. He was found to be hypotensive and bradycardic. He was admitted to the hospital via emergency room, was found to be in atrial fibrillation with controlled ventricular response. I was called because his serum troponin was elevated for admission. No history was given to me.  This morning patient is much more alert, awake and states that she had not been feeling well. He feels that his feet are numb and this has been ongoing for a while, he was recently started on a medication for the same, states that that made is the worse and also his tingling and numbness worse. He has not noticed any bruise discoloration or ulceration history. No leg edema.  He also states that for the past one week he has been having dry cough. No fever. No chills. Denies any urinary disturbances. No further dark stools. No bloody stools.  He was recently told by his PCP that his kidneys are not working well and that he may need to see a kidney specialist.  Past Medical History  Diagnosis Date  . Coronary artery disease   . Hypertension   . Hypercholesterolemia     Past Surgical  History  Procedure Laterality Date  . Cardiac surgery      History reviewed. No pertinent family history. Social History:  reports that he has quit smoking. He does not have any smokeless tobacco history on file. He reports that he does not use illicit drugs. His alcohol history is not on file.  Allergies: No Known Allergies  Medications Prior to Admission  Medication Sig Dispense Refill  . amLODipine (NORVASC) 2.5 MG tablet Take 2.5 mg by mouth every morning.      . clopidogrel (PLAVIX) 75 MG tablet Take 75 mg by mouth every morning.      . gabapentin (NEURONTIN) 100 MG capsule Take 100 mg by mouth 3 (three) times daily.      Marland Kitchen latanoprost (XALATAN) 0.005 % ophthalmic solution Place 1 drop into both eyes at bedtime.      Marland Kitchen levothyroxine (SYNTHROID, LEVOTHROID) 50 MCG tablet Take 50 mcg by mouth daily before breakfast.      . rosuvastatin (CRESTOR) 10 MG tablet Take 10 mg by mouth every evening.      . valsartan (DIOVAN) 160 MG tablet Take 160 mg by mouth every morning.      . Vitamin D, Ergocalciferol, (DRISDOL) 50000 UNITS CAPS capsule Take 50,000 Units by mouth every Friday.           Review of Systems - please see my history of present illness, other systems negative.  Blood pressure 97/68, pulse 88, temperature 98.2  F (36.8 C), temperature source Oral, resp. rate 18, height $RemoveBe'6\' 5"'osYYYUvrh$  (1.956 m), weight 78.3 kg (172 lb 9.9 oz), SpO2 100.00%. General appearance: alert, cooperative, mild distress, pale and Tachypneic. Eyes: negative findings: lids and lashes normal and corneas clear Neck: no adenopathy, no carotid bruit, no JVD, supple, symmetrical, trachea midline and thyroid not enlarged, symmetric, no tenderness/mass/nodules Neck: JVP - normal, carotids 2+= without bruits Resp: Bilateral extensive basal course crackles present. Accessory muscle use present. Chest wall: no tenderness, Accessory muscle use present, mild respiratory distress evident. Cardio: S1 is variable, S2 is  normal, distant heart sounds. No gallop or murmur appreciated. GI: soft, non-tender; bowel sounds normal; no masses,  no organomegaly and Mild tenderness in the right hypogastrium percent. Extremities: extremities normal, atraumatic, no cyanosis or edema and Trace ankle edema present. Mild foot deformity evident. Pulses: Carotid pulses are normal. Femoral pulses are normal. Popliteal and pedal pulses could not be felt. No evidence of acute arterial insufficiency. Skin: Skin color, texture, turgor normal. No rashes or lesions or Mild ecchymosis of the right foot evident. Neurologic: Grossly normal  Results for orders placed during the hospital encounter of 07/21/2014 (from the past 48 hour(s))  CBC WITH DIFFERENTIAL     Status: Abnormal   Collection Time    07/02/2014  9:40 PM      Result Value Ref Range   WBC 18.1 (*) 4.0 - 10.5 K/uL   RBC 3.19 (*) 4.22 - 5.81 MIL/uL   Hemoglobin 8.8 (*) 13.0 - 17.0 g/dL   HCT 28.3 (*) 39.0 - 52.0 %   MCV 88.7  78.0 - 100.0 fL   MCH 27.6  26.0 - 34.0 pg   MCHC 31.1  30.0 - 36.0 g/dL   RDW 14.7  11.5 - 15.5 %   Platelets 334  150 - 400 K/uL   Neutrophils Relative % 86 (*) 43 - 77 %   Neutro Abs 15.6 (*) 1.7 - 7.7 K/uL   Lymphocytes Relative 6 (*) 12 - 46 %   Lymphs Abs 1.0  0.7 - 4.0 K/uL   Monocytes Relative 8  3 - 12 %   Monocytes Absolute 1.4 (*) 0.1 - 1.0 K/uL   Eosinophils Relative 0  0 - 5 %   Eosinophils Absolute 0.1  0.0 - 0.7 K/uL   Basophils Relative 0  0 - 1 %   Basophils Absolute 0.0  0.0 - 0.1 K/uL  COMPREHENSIVE METABOLIC PANEL     Status: Abnormal   Collection Time    07/28/2014  9:40 PM      Result Value Ref Range   Sodium 143  137 - 147 mEq/L   Potassium 5.2  3.7 - 5.3 mEq/L   Chloride 111  96 - 112 mEq/L   CO2 13 (*) 19 - 32 mEq/L   Glucose, Bld 184 (*) 70 - 99 mg/dL   BUN 72 (*) 6 - 23 mg/dL   Creatinine, Ser 3.34 (*) 0.50 - 1.35 mg/dL   Calcium 8.4  8.4 - 10.5 mg/dL   Total Protein 7.1  6.0 - 8.3 g/dL   Albumin 3.6  3.5 - 5.2  g/dL   AST 37  0 - 37 U/L   ALT 39  0 - 53 U/L   Alkaline Phosphatase 73  39 - 117 U/L   Total Bilirubin 0.4  0.3 - 1.2 mg/dL   GFR calc non Af Amer 15 (*) >90 mL/min   GFR calc Af Amer 18 (*) >90 mL/min  Comment: (NOTE)     The eGFR has been calculated using the CKD EPI equation.     This calculation has not been validated in all clinical situations.     eGFR's persistently <90 mL/min signify possible Chronic Kidney     Disease.   Anion gap 19 (*) 5 - 15  TROPONIN I     Status: Abnormal   Collection Time    07/25/2014  9:40 PM      Result Value Ref Range   Troponin I 0.72 (*) <0.30 ng/mL   Comment:            Due to the release kinetics of cTnI,     a negative result within the first hours     of the onset of symptoms does not rule out     myocardial infarction with certainty.     If myocardial infarction is still suspected,     repeat the test at appropriate intervals.     CRITICAL RESULT CALLED TO, READ BACK BY AND VERIFIED WITH:     BLACK,B RN 07/02/2014 2243 JORDANS  PRO B NATRIURETIC PEPTIDE     Status: Abnormal   Collection Time    07/09/2014  9:40 PM      Result Value Ref Range   Pro B Natriuretic peptide (BNP) 34562.0 (*) 0 - 450 pg/mL  I-STAT CG4 LACTIC ACID, ED     Status: Abnormal   Collection Time    07/20/2014  9:54 PM      Result Value Ref Range   Lactic Acid, Venous 3.05 (*) 0.5 - 2.2 mmol/L  I-STAT ARTERIAL BLOOD GAS, ED     Status: Abnormal   Collection Time    06/29/2014 10:01 PM      Result Value Ref Range   pH, Arterial 7.328 (*) 7.350 - 7.450   pCO2 arterial 29.6 (*) 35.0 - 45.0 mmHg   pO2, Arterial 71.0 (*) 80.0 - 100.0 mmHg   Bicarbonate 15.5 (*) 20.0 - 24.0 mEq/L   TCO2 16  0 - 100 mmol/L   O2 Saturation 93.0     Acid-base deficit 9.0 (*) 0.0 - 2.0 mmol/L   Patient temperature 98.6 F     Collection site RADIAL, ALLEN'S TEST ACCEPTABLE     Drawn by RT     Sample type ARTERIAL    POC OCCULT BLOOD, ED     Status: None   Collection Time    07/17/2014  10:33 PM      Result Value Ref Range   Fecal Occult Bld NEGATIVE  NEGATIVE  POC OCCULT BLOOD, ED     Status: Abnormal   Collection Time    07/21/2014 10:45 PM      Result Value Ref Range   Fecal Occult Bld POSITIVE (*) NEGATIVE  URINALYSIS, ROUTINE W REFLEX MICROSCOPIC     Status: Abnormal   Collection Time    07/25/2014 11:17 PM      Result Value Ref Range   Color, Urine YELLOW  YELLOW   APPearance CLOUDY (*) CLEAR   Specific Gravity, Urine 1.020  1.005 - 1.030   pH 5.0  5.0 - 8.0   Glucose, UA NEGATIVE  NEGATIVE mg/dL   Hgb urine dipstick SMALL (*) NEGATIVE   Bilirubin Urine NEGATIVE  NEGATIVE   Ketones, ur NEGATIVE  NEGATIVE mg/dL   Protein, ur 100 (*) NEGATIVE mg/dL   Urobilinogen, UA 1.0  0.0 - 1.0 mg/dL   Nitrite NEGATIVE  NEGATIVE   Leukocytes,  UA NEGATIVE  NEGATIVE  URINE MICROSCOPIC-ADD ON     Status: Abnormal   Collection Time    07/24/2014 11:17 PM      Result Value Ref Range   Squamous Epithelial / LPF FEW (*) RARE   WBC, UA 0-2  <3 WBC/hpf   RBC / HPF 0-2  <3 RBC/hpf   Bacteria, UA FEW (*) RARE   Casts HYALINE CASTS (*) NEGATIVE   Comment: GRANULAR CAST   Urine-Other AMORPHOUS URATES/PHOSPHATES    PROTIME-INR     Status: Abnormal   Collection Time    07/02/14  1:13 AM      Result Value Ref Range   Prothrombin Time 15.6 (*) 11.6 - 15.2 seconds   INR 1.24  0.00 - 1.49  MRSA PCR SCREENING     Status: None   Collection Time    07/02/14  1:50 AM      Result Value Ref Range   MRSA by PCR NEGATIVE  NEGATIVE   Comment:            The GeneXpert MRSA Assay (FDA     approved for NASAL specimens     only), is one component of a     comprehensive MRSA colonization     surveillance program. It is not     intended to diagnose MRSA     infection nor to guide or     monitor treatment for     MRSA infections.   Dg Chest Portable 1 View  07/28/2014   CLINICAL DATA:  CPR  EXAM: PORTABLE CHEST - 1 VIEW  COMPARISON:  None.  FINDINGS: Bibasilar atelectasis with mild  hypoventilation. Negative for edema. Question small bilateral pleural effusions.  IMPRESSION: Mild bibasilar atelectasis and small pleural effusions. Negative for edema.   Electronically Signed   By: Franchot Gallo M.D.   On: 06/30/2014 22:31    Labs:   Lab Results  Component Value Date   WBC 18.1* 07/11/2014   HGB 8.8* 07/22/2014   HCT 28.3* 07/26/2014   MCV 88.7 06/30/2014   PLT 334 07/14/2014    Recent Labs Lab 07/09/2014 2140  NA 143  K 5.2  CL 111  CO2 13*  BUN 72*  CREATININE 3.34*  CALCIUM 8.4  PROT 7.1  BILITOT 0.4  ALKPHOS 73  ALT 39  AST 37  GLUCOSE 184*   Lab Results  Component Value Date   TROPONINI 0.72* 07/29/2014    Lipid Panel  No results found for this basename: chol, trig, hdl, cholhdl, vldl, ldlcalc    EKG: Atrial fibrillation with controlled ventricular response, low-voltage complexes. Inferior infarct, old, poor anterior wall forces cannot exclude anterior infarct, old. Nonspecific T. abnormality.   Assessment/Plan 1. Syncope due to ? Sepsis, renal failure, sick sinus syndrome 2. Acute renal failure on chronic renal insufficiency 3. Metabolic acidosis 4. Leucocytosis 5. respiratory distress, abnormal lung sounds 6. Anemia due to blood loss, guaiac positive stool, patient gives history to suggest dark stools 2 weeks ago.  I discussed with his PCP, his hemoglobin was 10.3 on 04/10/2014. Serum creatinine on 05/02/2014 was 2.3. 7. Atherosclerosis of coronary artery bypass graft of native heart without angina pectoris (414.05) Out patient Echo 08/30/12:LVEF 45-50%. Mod LVH. Mild AV sclerosis. Mild MVP, Mod MR. No change from previous Echo 4.5.12  Nuclear stress 3.9.12 Inf. & inf lat scar with mild peri infarct isch, old. Small ant lat isch new. EF 52% Overall a low risk study. shvc nuc 2/10: Inferior, inferoseptal  scar with mild ischemia. EF 44%.  CAD/ASHD: MI in 1992-Angioplasty (no stents). Suspect inferior MI. Out patient EKG 05/13/2014: Sinus rhythm with  first-degree AV block, PR interval of 400 ms, left axis deviation, left anterior fascicular block. Right bundle branch block. Trifascucular block. Nonspecific QRS widening, suspect LVH with repolarization. Compared to EKG 10/11/2013, no significant change.  8. PAD, mild abnormality in the ABI in 2013, no symptoms this is his claudication.   Recommendation: Patient has new onset of atrial fibrillation with  controlled  ventricular response, presently on IV heparin, however unconcerned about his history of dark stools, we will continue to monitor this closely. I will have GI evaluate him, he will need nephrology consultation. I also suspect he probably may have aspiration pneumonia. Findings are not consistent with acute clot heart failure although his BNP is certainly elevated he may have a component of heart failure due to underlying medical and metabolic issues. His serum troponin is minimally elevated. I will cycle his enzymes. I request hospitalist to consult on him for medical management given his renal failure, possible pneumonia, leukocytosis.  Laverda Page, MD 07/02/2014, 5:57 AM [facility]. [place] Pager: [phone] Office: [phone] If no answer: Cell:  [phone]

## 2014-07-03 ENCOUNTER — Inpatient Hospital Stay (HOSPITAL_COMMUNITY): Payer: Medicare Other

## 2014-07-03 DIAGNOSIS — I4891 Unspecified atrial fibrillation: Secondary | ICD-10-CM

## 2014-07-03 DIAGNOSIS — I4729 Other ventricular tachycardia: Secondary | ICD-10-CM

## 2014-07-03 DIAGNOSIS — J9600 Acute respiratory failure, unspecified whether with hypoxia or hypercapnia: Secondary | ICD-10-CM

## 2014-07-03 DIAGNOSIS — R579 Shock, unspecified: Secondary | ICD-10-CM

## 2014-07-03 DIAGNOSIS — I472 Ventricular tachycardia: Secondary | ICD-10-CM

## 2014-07-03 LAB — BASIC METABOLIC PANEL
Anion gap: 19 — ABNORMAL HIGH (ref 5–15)
BUN: 87 mg/dL — AB (ref 6–23)
CHLORIDE: 108 meq/L (ref 96–112)
CO2: 17 mEq/L — ABNORMAL LOW (ref 19–32)
CREATININE: 4.28 mg/dL — AB (ref 0.50–1.35)
Calcium: 7.9 mg/dL — ABNORMAL LOW (ref 8.4–10.5)
GFR calc Af Amer: 13 mL/min — ABNORMAL LOW (ref >90)
GFR, EST NON AFRICAN AMERICAN: 11 mL/min — AB (ref >90)
Glucose, Bld: 264 mg/dL — ABNORMAL HIGH (ref 70–99)
Potassium: 3.8 mEq/L (ref 3.7–5.3)
Sodium: 144 mEq/L (ref 137–147)

## 2014-07-03 LAB — BLOOD GAS, ARTERIAL
Acid-base deficit: 9.5 mmol/L — ABNORMAL HIGH (ref 0.0–2.0)
Bicarbonate: 15 mEq/L — ABNORMAL LOW (ref 20.0–24.0)
DRAWN BY: 41881
O2 Content: 3 L/min
O2 Saturation: 97 %
PH ART: 7.354 (ref 7.350–7.450)
PO2 ART: 88.3 mmHg (ref 80.0–100.0)
Patient temperature: 98.6
TCO2: 15.8 mmol/L (ref 0–100)
pCO2 arterial: 27.6 mmHg — ABNORMAL LOW (ref 35.0–45.0)

## 2014-07-03 LAB — COMPREHENSIVE METABOLIC PANEL
ALK PHOS: 60 U/L (ref 39–117)
ALT: 26 U/L (ref 0–53)
ANION GAP: 21 — AB (ref 5–15)
AST: 14 U/L (ref 0–37)
Albumin: 3.1 g/dL — ABNORMAL LOW (ref 3.5–5.2)
BUN: 86 mg/dL — ABNORMAL HIGH (ref 6–23)
CALCIUM: 7.9 mg/dL — AB (ref 8.4–10.5)
CO2: 16 mEq/L — ABNORMAL LOW (ref 19–32)
Chloride: 112 mEq/L (ref 96–112)
Creatinine, Ser: 4 mg/dL — ABNORMAL HIGH (ref 0.50–1.35)
GFR calc non Af Amer: 12 mL/min — ABNORMAL LOW (ref >90)
GFR, EST AFRICAN AMERICAN: 14 mL/min — AB (ref >90)
Glucose, Bld: 203 mg/dL — ABNORMAL HIGH (ref 70–99)
Potassium: 4.2 mEq/L (ref 3.7–5.3)
SODIUM: 149 meq/L — AB (ref 137–147)
Total Bilirubin: 0.4 mg/dL (ref 0.3–1.2)
Total Protein: 6.2 g/dL (ref 6.0–8.3)

## 2014-07-03 LAB — CBC
HCT: 25 % — ABNORMAL LOW (ref 39.0–52.0)
Hemoglobin: 8 g/dL — ABNORMAL LOW (ref 13.0–17.0)
MCH: 28.2 pg (ref 26.0–34.0)
MCHC: 32 g/dL (ref 30.0–36.0)
MCV: 88 fL (ref 78.0–100.0)
PLATELETS: 280 10*3/uL (ref 150–400)
RBC: 2.84 MIL/uL — AB (ref 4.22–5.81)
RDW: 14.9 % (ref 11.5–15.5)
WBC: 5.9 10*3/uL (ref 4.0–10.5)

## 2014-07-03 LAB — KAPPA/LAMBDA LIGHT CHAINS
Kappa free light chain: 3.39 mg/dL — ABNORMAL HIGH (ref 0.33–1.94)
Kappa, lambda light chain ratio: 1.58 (ref 0.26–1.65)
Lambda free light chains: 2.14 mg/dL (ref 0.57–2.63)

## 2014-07-03 LAB — PROCALCITONIN: Procalcitonin: 0.16 ng/mL

## 2014-07-03 LAB — HEPARIN LEVEL (UNFRACTIONATED): HEPARIN UNFRACTIONATED: 0.46 [IU]/mL (ref 0.30–0.70)

## 2014-07-03 LAB — PHOSPHORUS: Phosphorus: 5.4 mg/dL — ABNORMAL HIGH (ref 2.3–4.6)

## 2014-07-03 LAB — PARATHYROID HORMONE, INTACT (NO CA): PTH: 97.2 pg/mL — ABNORMAL HIGH (ref 14.0–72.0)

## 2014-07-03 LAB — MAGNESIUM
MAGNESIUM: 2.8 mg/dL — AB (ref 1.5–2.5)
Magnesium: 2.6 mg/dL — ABNORMAL HIGH (ref 1.5–2.5)

## 2014-07-03 MED ORDER — LIDOCAINE BOLUS VIA INFUSION
100.0000 mg | Freq: Once | INTRAVENOUS | Status: AC
  Administered 2014-07-03: 100 mg via INTRAVENOUS
  Filled 2014-07-03: qty 100

## 2014-07-03 MED ORDER — PHENYLEPHRINE HCL 10 MG/ML IJ SOLN
30.0000 ug/min | INTRAVENOUS | Status: DC
  Administered 2014-07-03 – 2014-07-04 (×2): 105 ug/min via INTRAVENOUS
  Filled 2014-07-03 (×2): qty 2

## 2014-07-03 MED ORDER — SODIUM CHLORIDE 0.9 % IJ SOLN
10.0000 mL | INTRAMUSCULAR | Status: DC | PRN
  Administered 2014-07-03: 20 mL via INTRAVENOUS
  Administered 2014-07-03: 10 mL via INTRAVENOUS

## 2014-07-03 MED ORDER — PHENYLEPHRINE HCL 10 MG/ML IJ SOLN
30.0000 ug/min | INTRAVENOUS | Status: DC
  Administered 2014-07-03: 50 ug/min via INTRAVENOUS
  Filled 2014-07-03 (×2): qty 1

## 2014-07-03 MED ORDER — SODIUM CHLORIDE 0.9 % IJ SOLN
3.0000 mL | INTRAMUSCULAR | Status: DC | PRN
  Administered 2014-07-03: 3 mL via INTRAVENOUS

## 2014-07-03 MED ORDER — LIDOCAINE IN D5W 4-5 MG/ML-% IV SOLN
2.0000 mg/min | INTRAVENOUS | Status: DC
  Administered 2014-07-03: 2 mg/min via INTRAVENOUS
  Filled 2014-07-03: qty 250
  Filled 2014-07-03: qty 500

## 2014-07-03 MED ORDER — LEVALBUTEROL HCL 0.63 MG/3ML IN NEBU: 0.6300 mg | INHALATION_SOLUTION | Freq: Four times a day (QID) | RESPIRATORY_TRACT | Status: DC | PRN

## 2014-07-03 MED ORDER — SODIUM CHLORIDE 0.9 % IV BOLUS (SEPSIS)
500.0000 mL | Freq: Once | INTRAVENOUS | Status: AC
  Administered 2014-07-03: 500 mL via INTRAVENOUS

## 2014-07-03 MED ORDER — PHENYLEPHRINE HCL 10 MG/ML IJ SOLN
30.0000 ug/min | INTRAVENOUS | Status: DC
  Administered 2014-07-03: 50 ug/min via INTRAVENOUS
  Administered 2014-07-03: 85 ug/min via INTRAVENOUS
  Filled 2014-07-03 (×3): qty 2

## 2014-07-03 MED ORDER — AMIODARONE IV BOLUS ONLY 150 MG/100ML
150.0000 mg | Freq: Once | INTRAVENOUS | Status: DC
  Filled 2014-07-03: qty 100

## 2014-07-03 MED ORDER — SODIUM CHLORIDE 0.9 % IJ SOLN
10.0000 mL | Freq: Two times a day (BID) | INTRAMUSCULAR | Status: DC
  Administered 2014-07-03: 10 mL via INTRAVENOUS
  Administered 2014-07-03: 40 mL via INTRAVENOUS

## 2014-07-03 MED ORDER — AMIODARONE HCL IN DEXTROSE 360-4.14 MG/200ML-% IV SOLN
30.0000 mg/h | INTRAVENOUS | Status: DC
  Administered 2014-07-03 (×2): 60 mg/h via INTRAVENOUS
  Administered 2014-07-03: 30 mg/h via INTRAVENOUS
  Filled 2014-07-03 (×7): qty 200

## 2014-07-03 MED ORDER — SODIUM CHLORIDE 0.9 % IJ SOLN
3.0000 mL | Freq: Two times a day (BID) | INTRAMUSCULAR | Status: DC
  Administered 2014-07-03 (×2): 3 mL via INTRAVENOUS

## 2014-07-03 MED ORDER — FUROSEMIDE 10 MG/ML IJ SOLN
160.0000 mg | Freq: Two times a day (BID) | INTRAVENOUS | Status: DC
  Administered 2014-07-03: 160 mg via INTRAVENOUS
  Filled 2014-07-03 (×3): qty 16

## 2014-07-03 MED ORDER — AMIODARONE LOAD VIA INFUSION
150.0000 mg | Freq: Once | INTRAVENOUS | Status: AC
  Administered 2014-07-03: 150 mg via INTRAVENOUS
  Filled 2014-07-03: qty 83.34

## 2014-07-03 MED ORDER — SODIUM CHLORIDE 0.9 % IV SOLN
1020.0000 mg | Freq: Once | INTRAVENOUS | Status: AC
  Administered 2014-07-03: 1020 mg via INTRAVENOUS
  Filled 2014-07-03: qty 34

## 2014-07-03 MED ORDER — AMIODARONE HCL IN DEXTROSE 360-4.14 MG/200ML-% IV SOLN
60.0000 mg/h | INTRAVENOUS | Status: AC
  Administered 2014-07-03: 60 mg/h via INTRAVENOUS
  Filled 2014-07-03 (×2): qty 200

## 2014-07-03 NOTE — Progress Notes (Signed)
Dr. Jacinto HalimGanji notified of sbp 70-80's on phenylephrine at 75-80 mcg/kg/min. Rhythm SB with 1 degree heart block and BBB. CVP 20. Order received to decrease amiodarone to 0.5 mg/min. Patient remains a DNR per Dr. Jacinto HalimGanji. He will update patient's son, Barbette MerinoJim Attig.

## 2014-07-03 NOTE — Progress Notes (Signed)
S:no SOB  No CP O:BP 66/55  Pulse 82  Temp(Src) 97.4 F (36.3 C) (Oral)  Resp 24  Ht 6\' 5"  (1.956 m)  Wt 78.3 kg (172 lb 9.9 oz)  BMI 20.47 kg/m2  SpO2 99%  Intake/Output Summary (Last 24 hours) at 07/03/14 1232 Last data filed at 07/03/14 1000  Gross per 24 hour  Intake 1064.7 ml  Output   3046 ml  Net -1981.3 ml   Weight change:  ZOX:WRUEAGen:awake and alert CVS:RRR Resp:bibasilar crackles Abd:+ BS NTND Ext: no edema NEURO:CNI Ox3 no asterixis   . antiseptic oral rinse  7 mL Mouth Rinse BID  . aspirin EC  81 mg Oral Daily  . furosemide  160 mg Intravenous Q12H  . methylPREDNISolone (SOLU-MEDROL) injection  60 mg Intravenous Q6H  . pantoprazole  40 mg Oral Daily  . piperacillin-tazobactam (ZOSYN)  IV  2.25 g Intravenous 3 times per day  . sodium chloride  10-40 mL Intravenous Q12H  . sodium chloride  3 mL Intravenous Q12H   [place] Renal  07/02/2014   CLINICAL DATA:  Acute renal insufficiency  EXAM: RENAL/URINARY TRACT ULTRASOUND COMPLETE  COMPARISON:  None.  FINDINGS: Right Kidney:  Length: 8.8 cm. Echogenicity within normal limits. No mass or hydronephrosis visualized.  Left Kidney:  Length: 9.8 cm. Echogenicity within normal limits. No mass or hydronephrosis visualized.  Bladder:  Not visualized  IMPRESSION: No hydronephrosis   Electronically Signed   By: Annia Beltrew  Davis M.D.   On: 07/02/2014 17:58   Nm Pulmonary Perf And Vent  07/02/2014   CLINICAL DATA:  Respiratory distress  EXAM: NUCLEAR MEDICINE VENTILATION - PERFUSION LUNG SCAN  TECHNIQUE: Ventilation images were obtained in multiple projections using inhaled aerosol technetium 99 M DTPA. Perfusion images were obtained in multiple projections after intravenous injection of Tc-6735m MAA.  RADIOPHARMACEUTICALS:  40.0 mCi Tc-335m DTPA aerosol and 6.0 mCi Tc-1935m MAA  COMPARISON:  Current chest radiograph  FINDINGS: Ventilation: There is heterogeneous accumulation of the aerosol radiotracer within the lungs. Relative decreased radiotracer  accumulation is noted in the upper lobes.  Perfusion: There are no wedge-shaped perfusion defects. There is decreased radiotracer accumulation adjacent to the right oblique fissure and minor fissure. Left lung perfusion is normal.  IMPRESSION: 1. Very low probability pulmonary ventilation-perfusion study for pulmonary thromboembolism.   Electronically Signed   By: Amie Portlandavid  Ormond M.D.   On: 07/02/2014 13:51   Dg Chest Port 1 View  07/03/2014   CLINICAL DATA:  Central catheter placement  EXAM: PORTABLE CHEST - 1 VIEW  COMPARISON:  Study obtained earlier in the day  FINDINGS: Central catheter tip is in the superior vena cava. No pneumothorax. There is mild persist interstitial edema, stable. There is patchy bibasilar atelectasis. Cardiomegaly is again noted with pulmonary vascularity within normal limits. No adenopathy.  IMPRESSION: Central catheter tip in the superior vena cava. Otherwise no change from study obtained earlier in the day.   Electronically Signed   By: Bretta BangWilliam  Woodruff M.D.   On: 07/03/2014 08:02   Dg Chest Port 1 View  07/03/2014   CLINICAL DATA:  Difficulty breathing  EXAM: PORTABLE CHEST - 1 VIEW  COMPARISON:  July 02, 2014  FINDINGS: There is patchy bibasilar atelectatic change, stable. No consolidation is appreciable. Mild interstitial edema is slightly less prominent compared to 1 day prior. No new opacity. Heart is enlarged with pulmonary vascularity within normal limits. No pneumothorax. No adenopathy.  IMPRESSION: Patchy bibasilar atelectatic change. Cardiomegaly with mild edema. There is slightly less  edema compared to 1 day prior. Suspect a degree of underlying congestive heart failure.   Electronically Signed   By: Bretta Bang M.D.   On: 07/03/2014 06:58   Dg Chest Port 1 View  07/02/2014   CLINICAL DATA:  Short of breath  EXAM: PORTABLE CHEST - 1 VIEW  COMPARISON:  07/18/2014  FINDINGS: There is vascular congestion, greater on the right, and mild hazy right perihilar airspace  opacity. Mild cardiomegaly is stable. Mild hazy basilar opacity is likely due to a combination of atelectasis and small effusions. No pneumothorax.  IMPRESSION: 1. Allowing for differences in patient positioning and technique, there has been no substantial change from the prior study. Findings suggest mild asymmetric edema. 2. Mild stable basilar atelectasis and probable small effusions.   Electronically Signed   By: Amie Portland M.D.   On: 07/02/2014 08:45   Dg Chest Portable 1 View  07/07/2014   CLINICAL DATA:  CPR  EXAM: PORTABLE CHEST - 1 VIEW  COMPARISON:  None.  FINDINGS: Bibasilar atelectasis with mild hypoventilation. Negative for edema. Question small bilateral pleural effusions.  IMPRESSION: Mild bibasilar atelectasis and small pleural effusions. Negative for edema.   Electronically Signed   By: Marlan Palau M.D.   On: July 04, 2014 22:31   BMET    Component Value Date/Time   NA 149* 07/03/2014 0158   K 4.2 07/03/2014 0158   CL 112 07/03/2014 0158   CO2 16* 07/03/2014 0158   GLUCOSE 203* 07/03/2014 0158   BUN 86* 07/03/2014 0158   CREATININE 4.00* 07/03/2014 0158   CALCIUM 7.9* 07/03/2014 0158   GFRNONAA 12* 07/03/2014 0158   GFRAA 14* 07/03/2014 0158   CBC    Component Value Date/Time   WBC 5.9 07/03/2014 0158   RBC 2.84* 07/03/2014 0158   HGB 8.0* 07/03/2014 0158   HCT 25.0* 07/03/2014 0158   PLT 280 07/03/2014 0158   MCV 88.0 07/03/2014 0158   MCH 28.2 07/03/2014 0158   MCHC 32.0 07/03/2014 0158   RDW 14.9 07/03/2014 0158   LYMPHSABS 1.0 07/02/2014 2140   MONOABS 1.4* 04-Jul-2014 2140   EOSABS 0.1 07/09/2014 2140   BASOSABS 0.0 07/08/2014 2140     Assessment: 1. 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 UO excellent but Scr trending a little higher related to persistent hypotension.  Renal US unremarkable and no sig proteinuria 2. V TACH 3. Fe Def anemia, received IV iron 4. Hypotension on Neo  Plan: 1. Cont diuresis 2. Daily Scr  Marce Charlesworth T

## 2014-07-03 NOTE — Progress Notes (Signed)
0047 pt. Had 50+ beat run of VTach. Pt. A&Ox4, maintained pulse and BP. MD Jacinto HalimGanji paged.   0059 while on phone with Dr. Jacinto HalimGanji pt. Had another 50+ beat run of VTach. Pt. Maintained pulse and BP A&Ox4. Notified RR RN and rest of staff RNs. ELINK button paged. EKG obtained.  0100 pt. Has another 50+ beat run of VTach, while still on the phone with MD Jacinto HalimGanji. MD gives new orders for amiodarone, labs to be drawn and to call PCCM. Pt. Still A&Ox4, BP dropped to 60s/40s, skin is diaphoretic, and dusky.  0126 pt. Has another 50+ beat run of VTach after a bolus of amiodarone and fluid bolus. Pt. Still A&Ox4 BP still low, skin is diaphoretic and dusky, respirations were shallow and used accessory muscles. Called PCCM and Ganji to [employer] plan. PCCM told RN to [facility]. Ganji paged and was updated on pt. Condition. Ganji wanted to intubate and transfer to ICU. PCCM came to assess pt. And transfer orders were put in. No intubation at this time.   0200 report is called to 2S15 and pt. Is transferred. Pt. Son is called and updated on pt. Condition.

## 2014-07-03 NOTE — Consult Note (Signed)
ELECTROPHYSIOLOGY CONSULT NOTE    Patient ID: Glenn Meza MRN: [id number], DOB/AGE: 05/08/1927 80 y.o.  Admit date: 06/30/2014 Date of Consult: 07-03-14  Primary Physician: Pearson Grippe, MD Primary Cardiologist: Jacinto Halim  Reason for Consultation: VT  HPI:  Glenn Meza is a 80 y.o. male with a past medical history significant for CAD, hypertension, and hyperlipidemia.  He was in his usual state of health until about 1 week preceding admission when he developed syncopal spells that occurred without prodrome.  EMS was called after he had increased frequency of episodes over the weekend and he was brought to [facility] for further evaluation.  He was found to be hypotensive and bradycardic with new onset atrial fibrillation.  Troponin on admission was elevated.  Telemetry has demonstrated runs of NSVT with some sustained ventricular tachycardia requiring external cardioversion.    Echo 2013 demonstrated EF 45-50%, moderate LVH, moderate MR.  Last nuclear study 01-2011 demonstrated inferior and inferior lateral scar with mild peri-infarct ischemia; EF 52%.    Troponin has been elevated and Dr Jacinto Halim feels that he likely had an MI 2 weeks ago when symptoms of dyspnea and syncope began.  His creat is elevated and so he is not candidate for heart catheterization at this time.  He has been placed on IV Amiodarone and Lidocaine. His ventricular ectopy has improved.  He currently denies chest pain, he does state that his shortness of breath is improved from baseline.  He has had some altered mental status with episodes of VT this admission.  Denies recent fevers, chills, nausea, vomiting.  ROS is otherwise negative.   Past Medical History  Diagnosis Date  . Coronary artery disease   . Hypertension   . Hypercholesterolemia      Surgical History:  Past Surgical History  Procedure Laterality Date  . Cardiac surgery       Prescriptions prior to admission  Medication Sig Dispense Refill  .  amLODipine (NORVASC) 2.5 MG tablet Take 2.5 mg by mouth every morning.      . clopidogrel (PLAVIX) 75 MG tablet Take 75 mg by mouth every morning.      . gabapentin (NEURONTIN) 100 MG capsule Take 100 mg by mouth 3 (three) times daily.      Marland Kitchen latanoprost (XALATAN) 0.005 % ophthalmic solution Place 1 drop into both eyes at bedtime.      Marland Kitchen levothyroxine (SYNTHROID, LEVOTHROID) 50 MCG tablet Take 50 mcg by mouth daily before breakfast.      . rosuvastatin (CRESTOR) 10 MG tablet Take 10 mg by mouth every evening.      . valsartan (DIOVAN) 160 MG tablet Take 160 mg by mouth every morning.      . Vitamin D, Ergocalciferol, (DRISDOL) 50000 UNITS CAPS capsule Take 50,000 Units by mouth every Friday.         Inpatient Medications:  . antiseptic oral rinse  7 mL Mouth Rinse BID  . aspirin EC  81 mg Oral Daily  . ferumoxytol  1,020 mg Intravenous Once  . furosemide  160 mg Intravenous Q12H  . ipratropium-albuterol  3 mL Nebulization QID  . methylPREDNISolone (SOLU-MEDROL) injection  60 mg Intravenous Q6H  . pantoprazole  40 mg Oral Daily  . piperacillin-tazobactam (ZOSYN)  IV  2.25 g Intravenous 3 times per day   . amiodarone 60 mg/hr (07/03/14 0850)  . heparin Stopped (07/03/14 0500)  . lidocaine 2 mg/min (07/03/14 1000)  . phenylephrine (NEO-SYNEPHRINE) Adult infusion  Allergies: No Known Allergies  History   Social History  . Marital Status: Widowed    Spouse Name: N/A    Number of Children: N/A  . Years of Education: N/A   Occupational History  . Not on file.   Social History Main Topics  . Smoking status: Former Games developermoker  . Smokeless tobacco: Not on file  . Alcohol Use: Not on file  . Drug Use: No  . Sexual Activity: Not on file   Other Topics Concern  . Not on file   Social History Narrative  . No narrative on file     Family History - CAD  BP 84/60  Pulse 62  Temp(Src) 97.3 F (36.3 C) (Oral)  Resp 16  Ht 6\' 5"  (1.956 m)  Wt 172 lb 9.9 oz (78.3 kg)  BMI  20.47 kg/m2  SpO2 99%  Physical Exam: unwell appearing elderly man, NAD HEENT: Unremarkable,[place], AT Neck:  8 JVD, no thyromegally Back:  No CVA tenderness Lungs:  Scattered rales bilaterally with no wheezes or rhonchi. Minimal increased work of breathing. HEART:  IRegular rate rhythm, no murmurs, no rubs, no clicks Abd:  soft, positive bowel sounds, no organomegally, no rebound, no guarding Ext:  2 plus pulses, cool extremities Skin:  No rashes no nodules Neuro:  CN II through XII intact, motor grossly intact  Labs:   Lab Results  Component Value Date   WBC 5.9 07/03/2014   HGB 8.0* 07/03/2014   HCT 25.0* 07/03/2014   MCV 88.0 07/03/2014   PLT 280 07/03/2014    Recent Labs Lab 07/03/14 0158  NA 149*  K 4.2  CL 112  CO2 16*  BUN 86*  CREATININE 4.00*  CALCIUM 7.9*  PROT 6.2  BILITOT 0.4  ALKPHOS 60  ALT 26  AST 14  GLUCOSE 203*   Lab Results  Component Value Date   TROPONINI 0.76* 07/02/2014     Radiology/Studies: [place] Renal 07/02/2014   CLINICAL DATA:  Acute renal insufficiency  EXAM: RENAL/URINARY TRACT ULTRASOUND COMPLETE  COMPARISON:  None.  FINDINGS: Right Kidney:  Length: 8.8 cm. Echogenicity within normal limits. No mass or hydronephrosis visualized.  Left Kidney:  Length: 9.8 cm. Echogenicity within normal limits. No mass or hydronephrosis visualized.  Bladder:  Not visualized  IMPRESSION: No hydronephrosis   Electronically Signed   By: Annia Beltrew  Davis M.D.   On: 07/02/2014 17:58   Nm Pulmonary Perf And Vent 07/02/2014   CLINICAL DATA:  Respiratory distress  EXAM: NUCLEAR MEDICINE VENTILATION - PERFUSION LUNG SCAN  TECHNIQUE: Ventilation images were obtained in multiple projections using inhaled aerosol technetium 99 M DTPA. Perfusion images were obtained in multiple projections after intravenous injection of Tc-1565m MAA.  RADIOPHARMACEUTICALS:  40.0 mCi Tc-1265m DTPA aerosol and 6.0 mCi Tc-6465m MAA  COMPARISON:  Current chest radiograph  FINDINGS: Ventilation: There is heterogeneous  accumulation of the aerosol radiotracer within the lungs. Relative decreased radiotracer accumulation is noted in the upper lobes.  Perfusion: There are no wedge-shaped perfusion defects. There is decreased radiotracer accumulation adjacent to the right oblique fissure and minor fissure. Left lung perfusion is normal.  IMPRESSION: 1. Very low probability pulmonary ventilation-perfusion study for pulmonary thromboembolism.   Electronically Signed   By: Amie Portlandavid  Ormond M.D.   On: 07/02/2014 13:51   Dg Chest Port 1 View 07/03/2014   CLINICAL DATA:  Central catheter placement  EXAM: PORTABLE CHEST - 1 VIEW  COMPARISON:  Study obtained earlier in the day  FINDINGS: Central catheter tip is  in the superior vena cava. No pneumothorax. There is mild persist interstitial edema, stable. There is patchy bibasilar atelectasis. Cardiomegaly is again noted with pulmonary vascularity within normal limits. No adenopathy.  IMPRESSION: Central catheter tip in the superior vena cava. Otherwise no change from study obtained earlier in the day.   Electronically Signed   By: Bretta Bang M.D.   On: 07/03/2014 08:02   ZOX:WRUEAV fib with a controlled VR, inferior MI, age undetermined, and lateral ST depression  TELEMETRY: atrial fib with frequent runs of VT  A/P 1. Probable out of hospital MI complicated by shock, requiring IV pressors 2. new onset atrial fibrillation 3. Anemia, likely due to GI bleeding 4. Worsening renal insufficiency 5. VT 6. Syncope, likely due to #5. Rec: the patient is critically ill. He is presently not an interventional candidate with anemia, likely due to acute blood loss. He has been transfused. His ventricular arrhythmias are improved with IV lidocaine and amiodarone. I would continue these meds, and hopefully he will stabilize and be able to be weaned off of her ionotropes. His prognosis is very poor.   Leonia Reeves.D.

## 2014-07-03 NOTE — Procedures (Signed)
Central Venous Catheter Insertion Procedure Note Glenn Meza [id number] 07-30-1927  Procedure: Insertion of Central Venous Catheter Indications: Assessment of intravascular volume, Drug and/or fluid administration and Frequent blood sampling  Procedure Details Consent: Risks of procedure as well as the alternatives and risks of each were explained to the (patient/caregiver).  Consent for procedure obtained. Time Out: Verified patient identification, verified procedure, site/side was marked, verified correct patient position, special equipment/implants available, medications/allergies/relevent history reviewed, required imaging and test results available.  Performed  Maximum sterile technique was used including antiseptics, cap, gloves, gown, hand hygiene, mask and sheet. Skin prep: Chlorhexidine; local anesthetic administered A antimicrobial bonded/coated triple lumen catheter was placed in the left internal jugular vein using the Seldinger technique. Ultrasound guidance used.Yes.   Catheter placed to 20 cm. Blood aspirated via all 3 ports and then flushed x 3. Line sutured x 2 and dressing applied.  Evaluation Blood flow good Complications: No apparent complications Patient did tolerate procedure well. Chest X-ray ordered to verify placement.  CXR: normal.  Brett CanalesSteve Meza ACNP [facility] PCCM Pager [phone] till 3 pm If no answer page [phone] 07/03/2014, 8:36 AM   Levy Pupaobert Dorsey Authement, MD, PhD 07/20/2014, 9:40 AM  Pulmonary and Critical Care [phone] or if no answer [phone]

## 2014-07-03 NOTE — Progress Notes (Signed)
07/03/14 0947  Vitals  Pulse Rate 77  ECG Heart Rate 72  Resp 17  Oxygen Therapy  SpO2 99 %  monitor shows sustained VT rate @ 170 bpm with altered level of consciousness. Patient unresponsive. Debfibrillated with 120 joules. Dr. Jacinto HalimGanji notified. Patient rhythm returned to afib after defibrillation x 1 and patient regained consciousness.. Dr. Jacinto HalimGanji to place order in computer for  DNR per previous phone conversation. Completed at [phone]. Patient's son updated on arrival to unit @ 0955.

## 2014-07-03 NOTE — Progress Notes (Signed)
ANTICOAGULATION CONSULT NOTE - Follow Up Consult  Pharmacy Consult for heparin Indication: atrial fibrillation  No Known Allergies  Patient Measurements: Height: 6\' 5"  (195.6 cm) Weight: 172 lb 9.9 oz (78.3 kg) IBW/kg (Calculated) : 89.1  Vital Signs: Temp: 97.2 F (36.2 C) (08/05 1537) Temp src: Axillary (08/05 1537) BP: 84/43 mmHg (08/05 1900) Pulse Rate: 72 (08/05 1900)  Labs:  Recent Labs  07/17/2014 2140 07/02/14 0113  07/02/14 0915 07/02/14 1405 07/02/14 1905 07/03/14 0157 07/03/14 0158 07/03/14 1537 07/03/14 1805  HGB 8.8*  --   --  8.7*  --   --   --  8.0*  --   --   HCT 28.3*  --   --  28.1*  --   --   --  25.0*  --   --   PLT 334  --   --  333  --   --   --  280  --   --   LABPROT  --  15.6*  --   --   --   --   --   --   --   --   INR  --  1.24  --   --   --   --   --   --   --   --   HEPARINUNFRC  --   --   < > 0.68  --  0.79* 0.46  --   --  <0.10*  CREATININE 3.34*  --   --  3.63*  --   --   --  4.00* 4.28*  --   TROPONINI 0.72*  --   --  0.80* 0.65* 0.76*  --   --   --   --   < > = values in this interval not displayed.  Estimated Creatinine Clearance: 14 ml/min (by C-G formula based on Cr of 4.28).   Medications:  Infusions:  . amiodarone 30 mg/hr (07/03/14 1800)  . heparin 800 Units/hr (07/03/14 1800)  . lidocaine 2 mg/min (07/03/14 1500)  . phenylephrine (NEO-SYNEPHRINE) Adult infusion      Assessment: 80 y/o male found unresponsive by family after 2 day history of syncopal episodes. Pharmacy managing IV heparin for new onset Afib. Of note, patient has had history of dark stools and GI has been consulted per cardiology note.   Heparin was turned off this morning for at least 3 hrs to put in a line but was restarted appropriately ~1030.  Heparin level is undetectable, despite being therapeutic at same rate this morning.  No problems with line infusing and no bleeding per RN. Hgb low, plts stable.   Goal of Therapy:  Heparin level 0.3-0.5  units/ml Monitor platelets by anticoagulation protocol: Yes   Plan:  - Increase heparin drip to 1000 units/hr - 8 hr heparin level - Daily heparin level and CBC - Monitor closely for signs of bleeding - F/u GOC   Agapito GamesAlison Tuyet Bader, PharmD, BCPS Clinical Pharmacist Pager: [phone] 07/03/2014 7:16 PM

## 2014-07-03 NOTE — Consult Note (Signed)
PULMONARY / CRITICAL CARE MEDICINE   Name: Glenn Meza MRN: [id number] DOB: [birth year]    ADMISSION DATE:  07/20/2014 CONSULTATION DATE:  07/03/2014  REFERRING MD :  Jacinto Halim  CHIEF COMPLAINT:  Kirby Funk  INITIAL PRESENTATION: 80 yo male with cardiac history presented to [facility] ED as resuscitated arrest 8/3. The cause was unknown, but he was admitted and treated as ACS given increased troponin. 8/5 early AM he began having NSVT with runs of up to 50 beats of Vtach. He never lost pulses or conciousness. PCCM asked to see.  STUDIES:  8/4 - V/Q > Low probability for PE 8/4 Renal US > no hydro  SIGNIFICANT EVENTS: 8/3 - questionable arrest, admitted to [facility] 8/5 - 50 beat run of VT, transferred to ICU   HISTORY OF PRESENT ILLNESS:  80 yo male with history of known coronary artery disease and history of probable inferior myocardial infarction in the remote past and moderate mitral regurgitation. He had been doing well until about a week ago he started noticing that he would have syncopal spells. No preceding events, denied any chest pain or shortness of breath. Over the weekend continued to 3-4 episodes, 8/3 he had an episode of syncope, after which he went unresponsive. Family initiated CPR, EMS was called, and was able to achieve ROSC. In ED he was found to be hypotensive and bradycardic, and was found to be in atrial fibrillation with controlled ventricular response. He was admitted and ACS workup was initiated. 8/5 early AM he had several runs of VT, longest was 50-60 beats. He never lost pulses or consciousness. Started on Amio bolus/gtt. PCCM asked to see.    PAST MEDICAL HISTORY :  Past Medical History  Diagnosis Date  . Coronary artery disease   . Hypertension   . Hypercholesterolemia    Past Surgical History  Procedure Laterality Date  . Cardiac surgery     Prior to Admission medications   Medication Sig Start Date End Date Taking? Authorizing Provider  amLODipine (NORVASC) 2.5 MG  tablet Take 2.5 mg by mouth every morning.   Yes Historical Provider, MD  clopidogrel (PLAVIX) 75 MG tablet Take 75 mg by mouth every morning.   Yes Historical Provider, MD  gabapentin (NEURONTIN) 100 MG capsule Take 100 mg by mouth 3 (three) times daily.   Yes Historical Provider, MD  latanoprost (XALATAN) 0.005 % ophthalmic solution Place 1 drop into both eyes at bedtime.   Yes Historical Provider, MD  levothyroxine (SYNTHROID, LEVOTHROID) 50 MCG tablet Take 50 mcg by mouth daily before breakfast.   Yes Historical Provider, MD  rosuvastatin (CRESTOR) 10 MG tablet Take 10 mg by mouth every evening.   Yes Historical Provider, MD  valsartan (DIOVAN) 160 MG tablet Take 160 mg by mouth every morning.   Yes Historical Provider, MD  Vitamin D, Ergocalciferol, (DRISDOL) 50000 UNITS CAPS capsule Take 50,000 Units by mouth every Friday.    Yes Historical Provider, MD   No Known Allergies  FAMILY HISTORY:  History reviewed. No pertinent family history. SOCIAL HISTORY:  reports that he has quit smoking. He does not have any smokeless tobacco history on file. He reports that he does not use illicit drugs. His alcohol history is not on file.  REVIEW OF SYSTEMS:   Bolds are positive  Constitutional: weight loss, gain, night sweats, Fevers, chills, fatigue .  HEENT: headaches, Sore throat, sneezing, nasal congestion, post nasal drip, Difficulty swallowing, Tooth/dental problems,blurry vision, ear ache CV:  chest pain, radiates: ,Orthopnea, PND,  swelling in lower extremities, dizziness, palpitations, syncope.  GI  heartburn, indigestion, abdominal pain, nausea, vomiting, diarrhea, change in bowel habits, loss of appetite, bloody stools.  Resp: cough, productive: , hemoptysis, dyspnea, chest pain, pleuritic.  Skin: rash or itching or icterus GU: dysuria, change in color of urine, urgency or frequency. flank pain, hematuria  MS: joint pain or swelling. decreased range of motion  Psych: change in mood or  affect. depression or anxiety.  Neuro: difficulty with speech, weakness, numbness, ataxia   SUBJECTIVE:  Denies pain. Awake  VITAL SIGNS: Temp:  [97.6 F (36.4 C)-98.4 F (36.9 C)] 97.7 F (36.5 C) (08/05 0145) Pulse Rate:  [56-112] 78 (08/05 0150) Resp:  [16-27] 23 (08/05 0150) BP: (65-108)/(38-82) 82/50 mmHg (08/05 0150) SpO2:  [86 %-100 %] 100 % (08/05 0150) FiO2 (%):  [55 %] 55 % (08/04 0400) Weight:  [78.3 kg (172 lb 9.9 oz)] 78.3 kg (172 lb 9.9 oz) (08/04 0400) HEMODYNAMICS:   VENTILATOR SETTINGS: Vent Mode:  [-]  FiO2 (%):  [55 %] 55 % INTAKE / OUTPUT:  Intake/Output Summary (Last 24 hours) at 07/03/14 0201 Last data filed at 07/03/14 0151  Gross per 24 hour  Intake 431.05 ml  Output   2001 ml  Net -1569.95 ml    PHYSICAL EXAMINATION: General:  Elderly male in NAD Neuro:  Alert, oriented HEENT:  /AT, no JVD noted Cardiovascular:  irreg irreg, tachy Lungs:  Exp rhonchi, mildly increased WOB with some accessory muscle use.  Abdomen:  Soft, non-tender, non-distended Musculoskeletal:  No acute deformity Skin:  Intact  LABS:  CBC  Recent Labs Lab 07/17/2014 2140 07/02/14 0915  WBC 18.1* 10.5  HGB 8.8* 8.7*  HCT 28.3* 28.1*  PLT 334 333   Coag's  Recent Labs Lab 07/02/14 0113  INR 1.24   BMET  Recent Labs Lab 07/11/2014 2140 07/02/14 0915  NA 143 146  K 5.2 5.5*  CL 111 113*  CO2 13* 17*  BUN 72* 76*  CREATININE 3.34* 3.63*  GLUCOSE 184* 103*   Electrolytes  Recent Labs Lab 06/29/2014 2140 07/02/14 0915  CALCIUM 8.4 8.5   Sepsis Markers  Recent Labs Lab 07/13/2014 2154  LATICACIDVEN 3.05*   ABG  Recent Labs Lab 07/15/2014 2201  PHART 7.328*  PCO2ART 29.6*  PO2ART 71.0*   Liver Enzymes  Recent Labs Lab 07/11/2014 2140 07/02/14 0915  AST 37 23  ALT 39 33  ALKPHOS 73 69  BILITOT 0.4 0.4  ALBUMIN 3.6 3.5   Cardiac Enzymes  Recent Labs Lab 07/23/2014 2140 07/02/14 0915 07/02/14 1405 07/02/14 1905  TROPONINI 0.72*  0.80* 0.65* 0.76*  PROBNP 34562.0*  --   --   --    Glucose No results found for this basename: GLUCAP,  in the last 168 hours  Imaging US Renal  07/02/2014   CLINICAL DATA:  Acute renal insufficiency  EXAM: RENAL/URINARY TRACT ULTRASOUND COMPLETE  COMPARISON:  None.  FINDINGS: Right Kidney:  Length: 8.8 cm. Echogenicity within normal limits. No mass or hydronephrosis visualized.  Left Kidney:  Length: 9.8 cm. Echogenicity within normal limits. No mass or hydronephrosis visualized.  Bladder:  Not visualized  IMPRESSION: No hydronephrosis   Electronically Signed   By: Annia Belt M.D.   On: 07/02/2014 17:58   Nm Pulmonary Perf And Vent  07/02/2014   CLINICAL DATA:  Respiratory distress  EXAM: NUCLEAR MEDICINE VENTILATION - PERFUSION LUNG SCAN  TECHNIQUE: Ventilation images were obtained in multiple projections using inhaled aerosol technetium 99 M DTPA.  Perfusion images were obtained in multiple projections after intravenous injection of Tc-625m MAA.  RADIOPHARMACEUTICALS:  40.0 mCi Tc-7925m DTPA aerosol and 6.0 mCi Tc-5225m MAA  COMPARISON:  Current chest radiograph  FINDINGS: Ventilation: There is heterogeneous accumulation of the aerosol radiotracer within the lungs. Relative decreased radiotracer accumulation is noted in the upper lobes.  Perfusion: There are no wedge-shaped perfusion defects. There is decreased radiotracer accumulation adjacent to the right oblique fissure and minor fissure. Left lung perfusion is normal.  IMPRESSION: 1. Very low probability pulmonary ventilation-perfusion study for pulmonary thromboembolism.   Electronically Signed   By: Amie Portlandavid  Ormond M.D.   On: 07/02/2014 13:51   Dg Chest Port 1 View  07/02/2014   CLINICAL DATA:  Short of breath  EXAM: PORTABLE CHEST - 1 VIEW  COMPARISON:  02-03-2014  FINDINGS: There is vascular congestion, greater on the right, and mild hazy right perihilar airspace opacity. Mild cardiomegaly is stable. Mild hazy basilar opacity is likely due to a  combination of atelectasis and small effusions. No pneumothorax.  IMPRESSION: 1. Allowing for differences in patient positioning and technique, there has been no substantial change from the prior study. Findings suggest mild asymmetric edema. 2. Mild stable basilar atelectasis and probable small effusions.   Electronically Signed   By: Amie Portlandavid  Ormond M.D.   On: 07/02/2014 08:45     ASSESSMENT / PLAN:  PULMONARY A: Tachypnea P:   Supplemental O2 as needed to maintain SpO2 >92% F/u CXR in am.  ABG  CARDIOVASCULAR A:  NSVT vs Paroxsymal SVT New onset AF with controlled ventricular rate.  Hypotension, responding to IVF Elevated troponin - Likely demand ischemia.  H/o CAD P:  Transfer to ICU Management per cardiology (Dr Jacinto HalimGanji) Continue amiodarone gtt Gentle IVF hydration Continue heparin gtt F/u echo May need CVL/pressors due to risk of volume overload with IVF.  RENAL A:   CKD III Hyperkalemia P:   Nephrology following Follow Bmet Lasix per renal  Correct lytes as indicated Gentle hydration  GASTROINTESTINAL A:   Possible GIB GI ppx P:   NPO for now PPI  HEMATOLOGIC A:   Anemia - blood loss? Guaiac pending P:  Follow CBC Anemia workup pending F/u guaiac.  Transfuse per normal ICU guidelines  INFECTIOUS A:   Possible aspiration PNA - low suspicion  P:   Abx: Zosyn, start date 8/3, day 2 Trend PCT to limit ABX days.  Monitor WBC and fever curve Consider d/c abx and follow clinically  ENDOCRINE A:   No acute issues P:   Monitor glucose on Bmet  NEUROLOGIC A:   No acute issues P:   Monitor   Joneen RoachPaul Hoffman, ACNP [place] [facility]/Critical Care Pager [phone] or [phone]  I have personally obtained a history, examined the patient, evaluated laboratory and imaging results, formulated the assessment and plan and placed orders.  CRITICAL CARE: The patient is critically ill with multiple organ systems failure and requires high  complexity decision making for assessment and support, frequent evaluation and titration of therapies, application of advanced monitoring technologies and extensive interpretation of multiple databases. Critical Care Time devoted to patient care services described in this note is 60 minutes.   Attending addendum: I have evaluated pt, reviewed studies. I agree with the note, A&P above. Will proceed with CVC placement to guide volume status, allow pressors. Will use phenylephrine given A fib. Suspect that this is cardiogenic shock +/- diuresis (-1800cc).   Levy Pupaobert Ytzel Gubler, MD, PhD 07/03/2014, 7:10 AM [place] [facility] [phone] or if no answer [phone]

## 2014-07-03 NOTE — Progress Notes (Signed)
I met patient's son Mohsin. Patient now getting more lethargic and also slowed mentation and mild respiratory distress. He is on 105 of Neosynephrine. Urine OP marginal with around 400 cc total for the shift.  Patient made DNR and if no improvement in medical  Status, plans are for withdrawal of care. However if he does improve, will continue aggressive medical care.  Patient's multi-organ failure status was updated including pulmonary edema, cardiogenic shock, acute renal failure, respiratory distress, anemia, myocardial infarction and severe LV systolic dysfunction was discussed with the patient's son who is in agreement with supportive care only.  I do not believe that placing the patient on life-support including ventilatory assist or defibrillating him at this point would make any long-term benefit. I suspect his renal failure we'll also get worse. His status was updated to the nurse in charge of the patient Lattie Haw, Therapist, sports.

## 2014-07-03 NOTE — Progress Notes (Addendum)
Subjective:  Patient had symptomatic sustained mental tachycardia last night. Appreciate pulmonary critical care involvement, patient was hypotensive following nonsustained mental tachycardia. This morning patient feels much better, states that his dyspnea is better.  Objective:  Vital Signs in the last 24 hours: Temp:  [97.3 F (36.3 C)-98.1 F (36.7 C)] 97.3 F (36.3 C) (08/05 0859) Pulse Rate:  [30-112] 30 (08/05 0630) Resp:  [16-27] 20 (08/05 0630) BP: (65-107)/(38-82) 99/73 mmHg (08/05 0630) SpO2:  [83 %-100 %] 83 % (08/05 0630)  Intake/Output from previous day: 08/04 0701 - 08/05 0700 In: 871.9 [I.V.:539.9; IV Piggyback:332] Out: 2786 [Urine:2785; Stool:1]  Physical Exam:   General appearance: alert, cooperative, appears stated age and no distress Eyes: negative findings: lids and lashes normal Neck: no adenopathy, no carotid bruit, supple, symmetrical, trachea midline, thyroid not enlarged, symmetric, no tenderness/mass/nodules and JCD present Neck: JVP - normal, carotids 2+= without bruits Resp: wheezes bibasilar Chest wall: no tenderness Cardio: S1 is variable, S2 is normal. 1to 2/6 systolic ejection murmur at the apex. No gallop appreciated. GI: soft, non-tender; bowel sounds normal; no masses,  no organomegaly Extremities: extremities normal, atraumatic, no cyanosis or edema    Lab Results: BMP  Recent Labs  07/21/2014 2140 07/02/14 0915 07/03/14 0158  NA 143 146 149*  K 5.2 5.5* 4.2  CL 111 113* 112  CO2 13* 17* 16*  GLUCOSE 184* 103* 203*  BUN 72* 76* 86*  CREATININE 3.34* 3.63* 4.00*  CALCIUM 8.4 8.5 7.9*  GFRNONAA 15* 14* 12*  GFRAA 18* 16* 14*    CBC  Recent Labs Lab 07/24/2014 2140  07/03/14 0158  WBC 18.1*  < > 5.9  RBC 3.19*  < > 2.84*  HGB 8.8*  < > 8.0*  HCT 28.3*  < > 25.0*  PLT 334  < > 280  MCV 88.7  < > 88.0  MCH 27.6  < > 28.2  MCHC 31.1  < > 32.0  RDW 14.7  < > 14.9  LYMPHSABS 1.0  --   --   MONOABS 1.4*  --   --   EOSABS  0.1  --   --   BASOSABS 0.0  --   --   < > = values in this interval not displayed.  HEMOGLOBIN A1C No results found for this basename: HGBA1C, MPG    Cardiac Panel (last 3 results)  Recent Labs  07/02/14 0915 07/02/14 1405 07/02/14 1905  TROPONINI 0.80* 0.65* 0.76*    BNP (last 3 results)  Recent Labs  07/29/2014 2140  PROBNP 34562.0*    TSH  Recent Labs  07/02/14 1405  TSH 1.390    CHOLESTEROL No results found for this basename: CHOL,  in the last 8760 hours  Hepatic Function Panel  Recent Labs  07/17/2014 2140 07/02/14 0915 07/03/14 0158  PROT 7.1 6.7 6.2  ALBUMIN 3.6 3.5 3.1*  AST 37 23 14  ALT 39 33 26  ALKPHOS 73 69 60  BILITOT 0.4 0.4 0.4    Imaging: Imaging results have been reviewed  Cardiac Studies:  EKG: 07/03/2014: Atrial fibrillation with controlled ventricular response, inferior infarct old. Right bundle-branch block. Nonspecific ST-T wave changes. Cannot exclude lateral ischemia. Echocardiogram 07/02/2014: Marked left ventricle systolic dysfunction, ejection fraction 25%, anterior wall akinetic, inferior wall akinesis. Poor echo window. Left ventricle is dilated. Mild to moderate RV dilatation, moderate pulmonary hypertension.  Assessment/Plan:  1. Cardiogenic shock, patient presently on Neo-Synephrine for maintenance of blood pressure. 2. Acute pulmonary edema 3. Syncope secondary  to Symptomatic sustained ventricular tachycardia, patient is presently on amiodarone. Continues to have nonsustained episodes asymptomatic this morning. Patient would be considered very high risk for sudden cardiac death, his presentation now is fairly clear that decreased ejection fraction and episodes of nonsustained VT, heart block does not explain his presentation. 4. Acute on chronic kidney disease, acute kidney failure, stage IV. 5. Coronary artery disease with history of inferior wall myocardial infarction in the past. Anterior wall motion abnormalities new, I  suspect probable myocardial infarction that occurred couple weeks ago. Serum troponins are very flat, suspect non-ST elevation MI.  Recommendation: Extremely difficult situation, the patient probably has had myocardial infarction probably 2 weeks ago when his symptoms of dyspnea and recurrent episodes of syncope started. He probably may need coronary angiography, however patient is extremely ill and probably may end up being on dialysis permanently. I have discussed with the son over the telephone, I will be meeting him again this evening to update him regarding his medical status of his father. Extremely guarded prognosis, and also obtain EP consultation. Appreciate nephrology input. Continue amiodarone at 1 mg an hour IV, continue IV heparin for now along with low-dose aspirin and Plavix. Hemoglobin appears to be stable. We'll have a low threshold to transfuse him. Patient had expressed not to continue life-sustaining measures in case  he was extremely ill. This was expressed by his son. At present continue full code.   Glenn Meza, M.D. 07/03/2014, 9:05 AM [facility] Pager: [phone] Office: [phone] If no answer: [phone] Addendum: Patient's condition very precarious and survival is low. Not a candidate for coronary angio and it will surely be a very high risk procedure. Patient's son aware and agrees with DNR. RN Misty Stanley spoke to him. Patient received a shock just now. No further resuscitative measures. DNR orders put.  I have also discussed with Sharrell Ku, MD (EP).

## 2014-07-03 NOTE — Progress Notes (Signed)
07/03/14 0915  Vitals  BP ! 84/60 mmHg  MAP (mmHg) 65  Pulse Rate 62  ECG Heart Rate 69  Resp 16  Oxygen Therapy  SpO2 99 %  Having increasing prolonged runs of VT with patient complaining he feels like he will pass out. VS above. Dr. Jacinto HalimGanji notified. Orders received.

## 2014-07-03 NOTE — Plan of Care (Signed)
Problem: Phase II Progression Outcomes Goal: Hemodynamically stable Outcome: Not Progressing Currently on 95 mcg/kg/min of phenyleprhrine, Lidocaine 2 mg/min and amiodarone 0.5 mg/min.

## 2014-07-03 NOTE — Significant Event (Signed)
Rapid Response Event Note  Overview: Time Called: 0101 Arrival Time: 0105 Event Type: Cardiac;Hypotension  Initial Focused Assessment:     Called by Selena BattenKim, RN that pt was having runs of VTACH as much as 50-60 beats  with pulse and no LOC, but hypotensive  with SBP 80s and c/o blurred vision. Pt admitted on 07/27/2014 s/p cardiac arrrest with ROSC On arrival to bedside pt is alert, oriented diaphoretic, RR 26 with mild WOB and use of some accessory muscles.  100% O2 sats  On 3l [place]. Bilateral rhonchi.  Cardiac monitor with AFib  Rate 97 and BP 98/60.  Pt denies chest pain,  0128 83/51    Afib 97        RR    24   100%  3l [place] 0140  65/47  102 Atrial Fib  26 100% 3 l [place]  Interventions:  Dr Jacinto HalimGanji notified by pt's nurse Denny PeonErin of Vtach episodes and received orders for Amiodarone 150 mg bolus given at 0122 and drip at 60 mg/hr.  NS IV bolus 500cc x 2 given with SBP remaining 60-80s, placed on Zoll defib pads, monitor position. Dr Jacinto [facility]imGanji [facility]   after  Amiodarone bolus . No loss of pulses , pt conscious and talking through episode of VTACH .   C/o blurred vision, denies CP  CBC, BMET, MAG, PHOS labs drawn,  CCM consult requested.  Joneen RoachPaul Hoffman, PA, CCM  arrived at bedside at 0150.  Pt transported with Zoll  to 2S15 at 0215.   Hand off  report given by Denny PeonErin, RN    Event Summary: Name of Physician Notified: Dr Jacinto HalimGanji at 0100  Name of Consulting Physician Notified: DR Molli KnockYacoub, CCM at [phone]  Outcome: Transferred (Comment)  Event End Time: 0215 (2S15)  Jules Schickrivette, Ramond Darnell K

## 2014-07-03 NOTE — Progress Notes (Signed)
UR Completed.  Glenn Meza [name] [phone] 07/03/2014  

## 2014-07-03 NOTE — Progress Notes (Signed)
ANTICOAGULATION CONSULT NOTE - Follow Up Consult  Pharmacy Consult for heparin Indication: atrial fibrillation  No Known Allergies  Patient Measurements: Height: 6\' 5"  (195.6 cm) Weight: 172 lb 9.9 oz (78.3 kg) IBW/kg (Calculated) : 89.1  Vital Signs: Temp: 97.3 F (36.3 C) (08/05 0859) Temp src: Oral (08/05 0859) BP: 84/60 mmHg (08/05 0915) Pulse Rate: 62 (08/05 0915)  Labs:  Recent Labs  06/30/2014 2140 07/02/14 0113 07/02/14 0915 07/02/14 1405 07/02/14 1905 07/03/14 0157 07/03/14 0158  HGB 8.8*  --  8.7*  --   --   --  8.0*  HCT 28.3*  --  28.1*  --   --   --  25.0*  PLT 334  --  333  --   --   --  280  LABPROT  --  15.6*  --   --   --   --   --   INR  --  1.24  --   --   --   --   --   HEPARINUNFRC  --   --  0.68  --  0.79* 0.46  --   CREATININE 3.34*  --  3.63*  --   --   --  4.00*  TROPONINI 0.72*  --  0.80* 0.65* 0.76*  --   --     Estimated Creatinine Clearance: 15 ml/min (by C-G formula based on Cr of 4).   Medications:  Infusions:  . amiodarone 60 mg/hr (07/03/14 0850)  . heparin Stopped (07/03/14 0500)  . lidocaine    . phenylephrine (NEO-SYNEPHRINE) Adult infusion      Assessment: 80 y/o male found unresponsive by family after 2 day history of syncopal episodes. Pharmacy managing IV heparin for new onset Afib. Of note, patient has had history of dark stools and GI has been consulted per cardiology note.   Heparin was turned off this morning for at least 3 hrs to put in a line; should be started soon. No overt bleeding noted, Hb is low but stable, platelets are normal.  Goal of Therapy:  Heparin level 0.3-0.5 units/ml Monitor platelets by anticoagulation protocol: Yes   Plan:  - Continue heparin drip at 800 units/hr - 8 hr heparin level - Daily heparin level and CBC - Monitor for s/sx of bleeding  [facility] Center-DyersvilleJennifer Hume, FuigPharm.D., BCPS Clinical Pharmacist Pager: [phone] 07/03/2014 10:10 AM

## 2014-07-03 NOTE — Progress Notes (Signed)
ANTICOAGULATION CONSULT NOTE - Follow Up Consult  Pharmacy Consult for heparin Indication: atrial fibrillation  Labs:  Recent Labs  06/29/2014 2140 07/02/14 0113 07/02/14 0915 07/02/14 1405 07/02/14 1905 07/03/14 0157 07/03/14 0158  HGB 8.8*  --  8.7*  --   --   --  8.0*  HCT 28.3*  --  28.1*  --   --   --  25.0*  PLT 334  --  333  --   --   --  280  LABPROT  --  15.6*  --   --   --   --   --   INR  --  1.24  --   --   --   --   --   HEPARINUNFRC  --   --  0.68  --  0.79* 0.46  --   CREATININE 3.34*  --  3.63*  --   --   --  4.00*  TROPONINI 0.72*  --  0.80* 0.65* 0.76*  --   --     Assessment/Plan:  80yo male now therapeutic on heparin after rate decreases. Will continue gtt at current rate and confirm stable with additional level.   Vernard GamblesVeronda Shaianne Nucci, PharmD, BCPS  07/03/2014,2:38 AM

## 2014-07-03 NOTE — Progress Notes (Signed)
PULMONARY / CRITICAL CARE MEDICINE   Name: Glenn Meza MRN: [id number] DOB: 1927-01-28    ADMISSION DATE:  07-27-14 CONSULTATION DATE:  07/03/2014  REFERRING MD :  Jacinto Halim  CHIEF COMPLAINT:  Kirby Funk  INITIAL PRESENTATION: 80 y/o male with cardiac history presented to [facility] ED as resuscitated arrest 8/3. The cause was unknown, but he was admitted and treated as ACS given increased troponin. 8/5 early AM he began having NSVT with runs of up to 50 beats of Vtach. He never lost pulses or conciousness. PCCM asked to see.  STUDIES:  8/4 - V/Q > Low probability for PE 8/4 Renal US > no hydro  SIGNIFICANT EVENTS: 8/3 - questionable arrest, admitted to [facility] 8/5 - 50 beat run of VT, transferred to ICU, then in ICU shocked for unstable VT  SUBJECTIVE:  RN reports pt continues to have runs of VT, pt remains on zoll at bedside.  Pt shocked at bedside for sustained VT.  VITAL SIGNS: Temp:  [97.3 F (36.3 C)-98.1 F (36.7 C)] 97.3 F (36.3 C) (08/05 0859) Pulse Rate:  [30-112] 62 (08/05 0915) Resp:  [16-27] 16 (08/05 0915) BP: (65-107)/(38-82) 84/60 mmHg (08/05 0915) SpO2:  [83 %-100 %] 99 % (08/05 0915) HEMODYNAMICS:   VENTILATOR SETTINGS:   INTAKE / OUTPUT:  Intake/Output Summary (Last 24 hours) at 07/03/14 0946 Last data filed at 07/03/14 0700  Gross per 24 hour  Intake 871.85 ml  Output   2786 ml  Net -1914.15 ml    PHYSICAL EXAMINATION: General:  Elderly male in NAD Neuro:  Alert, oriented HEENT:  [place]/AT, no JVD noted Cardiovascular:  irreg irreg, tachy Lungs:  Exp rhonchi, mildly increased WOB with some accessory muscle use.  Abdomen:  Soft, non-tender, non-distended Musculoskeletal:  No acute deformity Skin:  Intact  LABS:  CBC  Recent Labs Lab 07/27/2014 2140 07/02/14 0915 07/03/14 0158  WBC 18.1* 10.5 5.9  HGB 8.8* 8.7* 8.0*  HCT 28.3* 28.1* 25.0*  PLT 334 333 280   Coag's  Recent Labs Lab 07/02/14 0113  INR 1.24   BMET  Recent Labs Lab  Jul 27, 2014 2140 07/02/14 0915 07/03/14 0158  NA 143 146 149*  K 5.2 5.5* 4.2  CL 111 113* 112  CO2 13* 17* 16*  BUN 72* 76* 86*  CREATININE 3.34* 3.63* 4.00*  GLUCOSE 184* 103* 203*   Electrolytes  Recent Labs Lab 07-27-2014 2140 07/02/14 0915 07/03/14 0158  CALCIUM 8.4 8.5 7.9*  MG  --   --  2.6*  PHOS  --   --  5.4*   Sepsis Markers  Recent Labs Lab July 27, 2014 2154 07/03/14 0349  LATICACIDVEN 3.05*  --   PROCALCITON  --  0.16   ABG  Recent Labs Lab July 27, 2014 2201 07/03/14 0309  PHART 7.328* 7.354  PCO2ART 29.6* 27.6*  PO2ART 71.0* 88.3   Liver Enzymes  Recent Labs Lab 2014-07-27 2140 07/02/14 0915 07/03/14 0158  AST 37 23 14  ALT 39 33 26  ALKPHOS 73 69 60  BILITOT 0.4 0.4 0.4  ALBUMIN 3.6 3.5 3.1*   Cardiac Enzymes  Recent Labs Lab 2014-07-27 2140 07/02/14 0915 07/02/14 1405 07/02/14 1905  TROPONINI 0.72* 0.80* 0.65* 0.76*  PROBNP 34562.0*  --   --   --    Glucose No results found for this basename: GLUCAP,  in the last 168 hours  Imaging US Renal  07/02/2014   CLINICAL DATA:  Acute renal insufficiency  EXAM: RENAL/URINARY TRACT ULTRASOUND COMPLETE  COMPARISON:  None.  FINDINGS:  Right Kidney:  Length: 8.8 cm. Echogenicity within normal limits. No mass or hydronephrosis visualized.  Left Kidney:  Length: 9.8 cm. Echogenicity within normal limits. No mass or hydronephrosis visualized.  Bladder:  Not visualized  IMPRESSION: No hydronephrosis   Electronically Signed   By: Annia Beltrew  Davis M.D.   On: 07/02/2014 17:58   Nm Pulmonary Perf And Vent  07/02/2014   CLINICAL DATA:  Respiratory distress  EXAM: NUCLEAR MEDICINE VENTILATION - PERFUSION LUNG SCAN  TECHNIQUE: Ventilation images were obtained in multiple projections using inhaled aerosol technetium 99 M DTPA. Perfusion images were obtained in multiple projections after intravenous injection of Tc-2529m MAA.  RADIOPHARMACEUTICALS:  40.0 mCi Tc-3529m DTPA aerosol and 6.0 mCi Tc-8129m MAA  COMPARISON:  Current chest  radiograph  FINDINGS: Ventilation: There is heterogeneous accumulation of the aerosol radiotracer within the lungs. Relative decreased radiotracer accumulation is noted in the upper lobes.  Perfusion: There are no wedge-shaped perfusion defects. There is decreased radiotracer accumulation adjacent to the right oblique fissure and minor fissure. Left lung perfusion is normal.  IMPRESSION: 1. Very low probability pulmonary ventilation-perfusion study for pulmonary thromboembolism.   Electronically Signed   By: Amie Portlandavid  Ormond M.D.   On: 07/02/2014 13:51   Dg Chest Port 1 View  07/02/2014   CLINICAL DATA:  Short of breath  EXAM: PORTABLE CHEST - 1 VIEW  COMPARISON:  11/07/14  FINDINGS: There is vascular congestion, greater on the right, and mild hazy right perihilar airspace opacity. Mild cardiomegaly is stable. Mild hazy basilar opacity is likely due to a combination of atelectasis and small effusions. No pneumothorax.  IMPRESSION: 1. Allowing for differences in patient positioning and technique, there has been no substantial change from the prior study. Findings suggest mild asymmetric edema. 2. Mild stable basilar atelectasis and probable small effusions.   Electronically Signed   By: Amie Portlandavid  Ormond M.D.   On: 07/02/2014 08:45     ASSESSMENT / PLAN:  PULMONARY A: Tachypnea P:   Supplemental O2 as needed to maintain SpO2 >92% F/u CXR in am.   CARDIOVASCULAR A:  NSVT vs Paroxsymal SVT - required shock am 8/5 for sustained VT with AMS New onset AF with controlled ventricular rate. Cardiogenic Shock  Hypotension, responding to IVF Elevated troponin - Likely demand ischemia.  H/o CAD P:  Management per Cardiology (Dr Jacinto HalimGanji) Continue amiodarone gtt Lidocaine gtt per cards Gentle IVF hydration F/u echo Trend CVP Heparin gtt  DNR established per Dr. Jacinto HalimGanji  RENAL A:   CKD III Hyperkalemia P:   Nephrology following STAT Bmet/Mg Lasix per renal  Correct lytes as indicated Gentle  hydration  GASTROINTESTINAL A:   Possible GIB GI ppx P:   NPO for now PPI  HEMATOLOGIC A:   Anemia - blood loss? Guaiac pending P:  Follow CBC Anemia workup pending F/u guaiac.  Transfuse per normal ICU guidelines  INFECTIOUS A:   Possible aspiration PNA - low suspicion  P:   Abx: Zosyn, start date 8/3, day 3 Trend PCT to limit ABX days.  Monitor WBC and fever curve Consider d/c abx and follow clinically  ENDOCRINE A:   No acute issue P:   Monitor glucose on BMET  NEUROLOGIC A:   No acute issues P:   Monitor  Canary BrimBrandi Ollis, NP-C [facility] Pgr: [phone] or [phone]  Attending:  I have seen and examined the patient with nurse practitioner/resident and agree with the note above.   Unfortunately we had to shock him again this  morning due to VTach EP evaluating now Amiodarone, lidocaine per them  Plan   I have personally obtained a history, examined the patient, evaluated laboratory and imaging results, formulated the assessment and plan and placed orders.  CRITICAL CARE: Additional CC time by me this morning is 45 minutes.  [place], MD [place] [facility] Pager: [phone] Cell: [phone] If no response, call [phone]

## 2014-07-04 ENCOUNTER — Inpatient Hospital Stay (HOSPITAL_COMMUNITY): Payer: Medicare Other

## 2014-07-04 LAB — PROTEIN ELECTROPHORESIS, SERUM
ALBUMIN ELP: 52.2 % — AB (ref 55.8–66.1)
ALPHA-2-GLOBULIN: 15.2 % — AB (ref 7.1–11.8)
Alpha-1-Globulin: 6.1 % — ABNORMAL HIGH (ref 2.9–4.9)
Beta 2: 4.5 % (ref 3.2–6.5)
Beta Globulin: 7.6 % — ABNORMAL HIGH (ref 4.7–7.2)
GAMMA GLOBULIN: 14.4 % (ref 11.1–18.8)
M-Spike, %: NOT DETECTED g/dL
TOTAL PROTEIN ELP: 6.5 g/dL (ref 6.0–8.3)

## 2014-07-04 LAB — RENAL FUNCTION PANEL
Albumin: 3.1 g/dL — ABNORMAL LOW (ref 3.5–5.2)
Anion gap: 24 — ABNORMAL HIGH (ref 5–15)
BUN: 86 mg/dL — ABNORMAL HIGH (ref 6–23)
CO2: 17 mEq/L — ABNORMAL LOW (ref 19–32)
Calcium: 7.7 mg/dL — ABNORMAL LOW (ref 8.4–10.5)
Chloride: 108 mEq/L (ref 96–112)
Creatinine, Ser: 3.84 mg/dL — ABNORMAL HIGH (ref 0.50–1.35)
GFR calc Af Amer: 15 mL/min — ABNORMAL LOW (ref >90)
GFR calc non Af Amer: 13 mL/min — ABNORMAL LOW (ref >90)
Glucose, Bld: 194 mg/dL — ABNORMAL HIGH (ref 70–99)
Phosphorus: 6.4 mg/dL — ABNORMAL HIGH (ref 2.3–4.6)
Potassium: 3.7 mEq/L (ref 3.7–5.3)
Sodium: 149 mEq/L — ABNORMAL HIGH (ref 137–147)

## 2014-07-04 LAB — CBC
HCT: 27.3 % — ABNORMAL LOW (ref 39.0–52.0)
Hemoglobin: 8.8 g/dL — ABNORMAL LOW (ref 13.0–17.0)
MCH: 28.4 pg (ref 26.0–34.0)
MCHC: 32.2 g/dL (ref 30.0–36.0)
MCV: 88.1 fL (ref 78.0–100.0)
PLATELETS: 280 10*3/uL (ref 150–400)
RBC: 3.1 MIL/uL — ABNORMAL LOW (ref 4.22–5.81)
RDW: 15.2 % (ref 11.5–15.5)
WBC: 20.9 10*3/uL — ABNORMAL HIGH (ref 4.0–10.5)

## 2014-07-04 LAB — PROCALCITONIN: PROCALCITONIN: 0.16 ng/mL

## 2014-07-04 LAB — HEPARIN LEVEL (UNFRACTIONATED): Heparin Unfractionated: 0.16 IU/mL — ABNORMAL LOW (ref 0.30–0.70)

## 2014-07-04 MED ORDER — SODIUM CHLORIDE 0.9 % IV SOLN
INTRAVENOUS | Status: DC
  Administered 2014-07-04: 75 mL/h via INTRAVENOUS

## 2014-07-04 MED ORDER — SODIUM CHLORIDE 0.9 % IV BOLUS (SEPSIS)
500.0000 mL | Freq: Once | INTRAVENOUS | Status: AC
  Administered 2014-07-04: 500 mL via INTRAVENOUS

## 2014-07-04 MED ORDER — PHENYLEPHRINE HCL 10 MG/ML IJ SOLN
30.0000 ug/min | INTRAVENOUS | Status: DC
  Administered 2014-07-04: 105 ug/min via INTRAVENOUS
  Filled 2014-07-04: qty 4

## 2014-07-04 NOTE — Progress Notes (Signed)
eLink Physician-Brief Progress Note Patient Name: Glenn CookeyJames Marvin Meza DOB: 1927/06/07 MRN: [id number]  Date of Service  07/19/2014   HPI/Events of Note   Oliguric despite 500cc saline bolus  eICU Interventions  Start NS 75cc/hr   Intervention Category Intermediate Interventions: Oliguria - evaluation and management  [name] 07/17/2014, 4:51 AM

## 2014-07-04 NOTE — Progress Notes (Signed)
Pt went asystole on the monitor at 0611, pt DNR. Carollee HerterShannon RN and myself called TOD at 0615. CCM and MD Gandj both notified along with family and CDS.

## 2014-07-04 NOTE — Progress Notes (Signed)
ANTICOAGULATION CONSULT NOTE - Follow Up Consult  Pharmacy Consult for heparin Indication: atrial fibrillation  Labs:  Recent Labs  07/12/2014 2140 07/02/14 0113  07/02/14 0915 07/02/14 1405 07/02/14 1905 07/03/14 0157 07/03/14 0158 07/03/14 1537 07/03/14 1805 07/15/2014 0215 07/14/2014 0318  HGB 8.8*  --   --  8.7*  --   --   --  8.0*  --   --   --   --   HCT 28.3*  --   --  28.1*  --   --   --  25.0*  --   --   --   --   PLT 334  --   --  333  --   --   --  280  --   --   --   --   LABPROT  --  15.6*  --   --   --   --   --   --   --   --   --   --   INR  --  1.24  --   --   --   --   --   --   --   --   --   --   HEPARINUNFRC  --   --   < > 0.68  --  0.79* 0.46  --   --  <0.10*  --  0.16*  CREATININE 3.34*  --   --  3.63*  --   --   --  4.00* 4.28*  --  3.84*  --   TROPONINI 0.72*  --   --  0.80* 0.65* 0.76*  --   --   --   --   --   --   < > = values in this interval not displayed.   Assessment: 80yo male remains subtherapeutic on heparin after rate increase, modest adjustments 2/2 low goal.  Goal of Therapy:  Heparin level 0.3-0.5 units/ml   Plan:  Will increase heparin gtt by 2 units/kg/hr to 1150 units/hr and check level in 8hr.  Vernard GamblesVeronda Semisi Biela, PharmD, BCPS  07/22/2014,4:11 AM

## 2014-07-04 NOTE — Discharge Summary (Signed)
Physician Discharge Summary         DEATH SUMMARY  Patient ID: Glenn Meza MRN: [id number] DOB/AGE: [birth year] 80 y.o.  Admit date: 07/08/2014 Date of expiration : 07/01/2014 at 65:3AM  Primary Discharge Diagnosis Cardiac arrest due to CAD Secondary Discharge Diagnosis NSTEMI Ischemic cardiomyopathy Syncope secondary to Symptomatic sustained VT Cardiogenic shock Acute renal failure on chronic renal failure A. FIbrillation GI bleed with guaic positive stool Metabolic acidosis Hyperphosphatemia Hypoproteinemia  Significant Diagnostic Studies:  EKG: 07/03/2014: Atrial fibrillation with controlled ventricular response, inferior infarct old. Right bundle-branch block. Nonspecific ST-T wave changes. Cannot exclude lateral ischemia.  Echocardiogram 07/02/2014: Marked left ventricle systolic dysfunction, ejection fraction 25%, anterior wall akinetic, inferior wall akinesis. Poor echo window. Left ventricle is dilated. Mild to moderate RV dilatation, moderate pulmonary hypertension.   Consults:  PCCM for respiratory management and respiratory distress EP consult Dr. Sharrell KuGreg Taylor for Symptomatic VT  Hospital Course: patient admitted from home, when he had recurrent episodes of syncope, had CPR done by his family members, when EMS found him, he was lethargic, hypotensive and felt to be bradycardic.  He was admitted to the hospital with diagnosis of acute coronary syndrome and metabolic acidosis.  Patient had developed acute renal failure, probably due to hypoperfusion.  On the second day of hospital admission, patient had recurrent episodes of symptomatic went to play tachycardia, felt to be the etiology for his syncope.  Patient also had guaiac positive stool, he had dropped his hemoglobin from baseline from 10.5 to 8.2, however patient had to be started on IV heparin with careful monitoring of his hemoglobin.  This was due to new onset of atrial fibrillation.  On 07/03/2014, patient was  made DNR, however he had one episode of sustained VT for which she had to be defibrillated.  He also needed pressor support, was on Neo-Synephrine at very high doses, continued to remain hypotensive and in acute renal failure.  After long discussion with the patient's son, given his multiple medical comorbidities, the long-term outcome would be extremely guarded.  At that time family decided to make him comfort care/no CODE BLUE.  On 07/02/2014, patient became asystolic at 6:53 AM and hence was declared dead at that time.  Patient's family members were very appreciative of the care provided.  Please see the medical records for all the radiologic and laboratory abnormalities.

## 2014-07-30 DEATH — deceased

## 2014-11-11 ENCOUNTER — Ambulatory Visit: Payer: Medicare Other | Admitting: Family

## 2014-11-11 ENCOUNTER — Other Ambulatory Visit (HOSPITAL_COMMUNITY): Payer: Medicare Other

## 2014-11-20 ENCOUNTER — Encounter: Payer: Self-pay | Admitting: Family

## 2014-11-21 ENCOUNTER — Other Ambulatory Visit (HOSPITAL_COMMUNITY): Payer: Medicare Other

## 2014-11-21 ENCOUNTER — Ambulatory Visit: Payer: Medicare Other | Admitting: Family

## 2014-12-16 ENCOUNTER — Encounter: Payer: Self-pay | Admitting: Family

## 2015-11-12 IMAGING — CR DG CHEST 1V PORT
1 series · 1 of 1 positions shown · non-contrast
Comparison: Study obtained earlier in the day

CLINICAL DATA: Central catheter placement

EXAM:
PORTABLE CHEST - 1 VIEW

[portable]
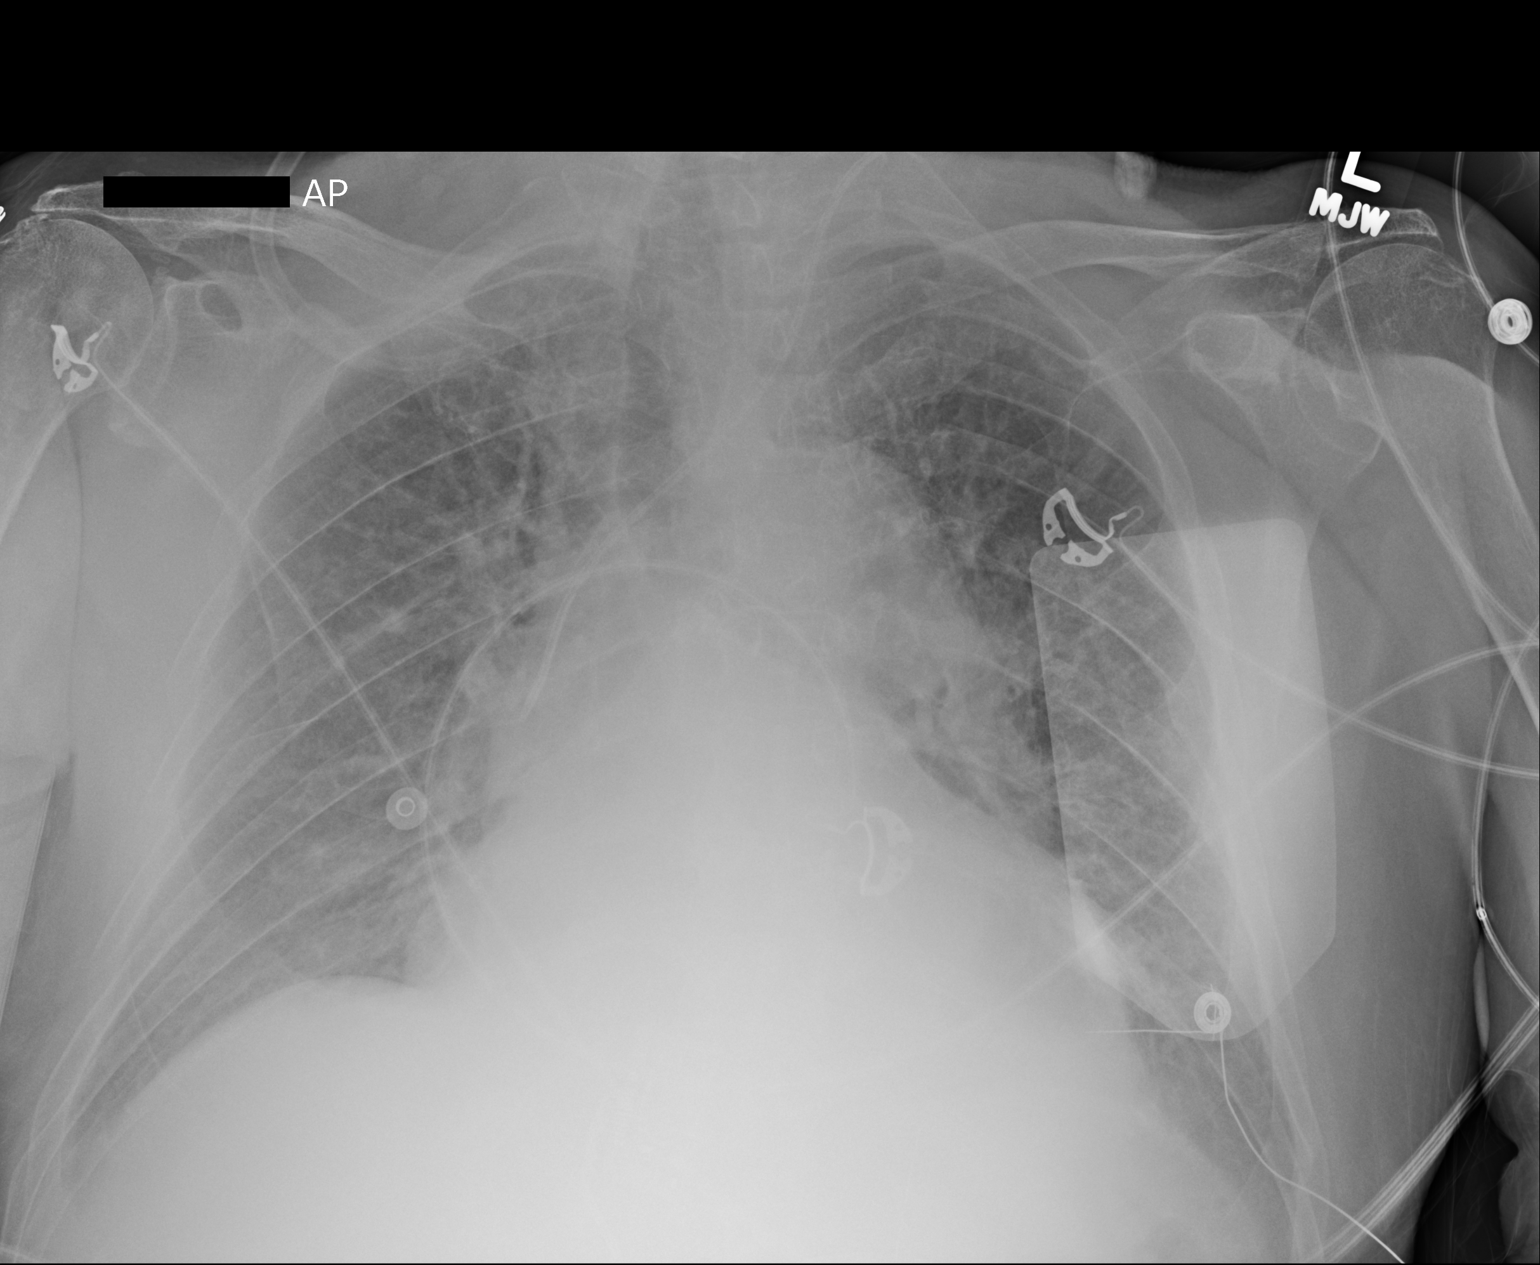

[1 of 1 positions shown; findings below may reference images not displayed]

FINDINGS: Central catheter tip is in the superior vena cava. No pneumothorax.
There is mild persist interstitial edema, stable. There is patchy
bibasilar atelectasis. Cardiomegaly is again noted with pulmonary
vascularity within normal limits. No adenopathy.
IMPRESSION: Central catheter tip in the superior vena cava. Otherwise no change
from study obtained earlier in the day.
